# Patient Record
Sex: Female | Born: 1955 | Race: White | Hispanic: No | Marital: Married | Smoking: Never smoker
Health system: Southern US, Community
[De-identification: ages and names within clinical notes are randomized; demographics above are authoritative.]

## PROBLEM LIST (undated history)

## (undated) DIAGNOSIS — G40909 Epilepsy, unspecified, not intractable, without status epilepticus: Secondary | ICD-10-CM

## (undated) DIAGNOSIS — IMO0002 Reserved for concepts with insufficient information to code with codable children: Secondary | ICD-10-CM

## (undated) DIAGNOSIS — E669 Obesity, unspecified: Secondary | ICD-10-CM

## (undated) DIAGNOSIS — M329 Systemic lupus erythematosus, unspecified: Secondary | ICD-10-CM

## (undated) DIAGNOSIS — R569 Unspecified convulsions: Secondary | ICD-10-CM

## (undated) DIAGNOSIS — D6861 Antiphospholipid syndrome: Secondary | ICD-10-CM

## (undated) HISTORY — DX: Obesity, unspecified: E66.9

## (undated) HISTORY — DX: Epilepsy, unspecified, not intractable, without status epilepticus: G40.909

## (undated) HISTORY — DX: Reserved for concepts with insufficient information to code with codable children: IMO0002

## (undated) HISTORY — DX: Antiphospholipid syndrome: D68.61

## (undated) HISTORY — PX: TONSILLECTOMY: SUR1361

## (undated) HISTORY — PX: WISDOM TOOTH EXTRACTION: SHX21

## (undated) HISTORY — DX: Systemic lupus erythematosus, unspecified: M32.9

## (undated) HISTORY — DX: Unspecified convulsions: R56.9

---

## 1998-02-13 ENCOUNTER — Encounter: Payer: Self-pay | Admitting: Emergency Medicine

## 1998-02-13 ENCOUNTER — Emergency Department (HOSPITAL_COMMUNITY): Admission: EM | Admit: 1998-02-13 | Discharge: 1998-02-13 | Payer: Self-pay | Admitting: Emergency Medicine

## 1998-11-19 ENCOUNTER — Other Ambulatory Visit: Admission: RE | Admit: 1998-11-19 | Discharge: 1998-11-19 | Payer: Self-pay | Admitting: Internal Medicine

## 1999-10-19 ENCOUNTER — Encounter: Admission: RE | Admit: 1999-10-19 | Discharge: 1999-10-19 | Payer: Self-pay | Admitting: Internal Medicine

## 1999-10-19 ENCOUNTER — Encounter: Payer: Self-pay | Admitting: Internal Medicine

## 2000-03-01 ENCOUNTER — Other Ambulatory Visit: Admission: RE | Admit: 2000-03-01 | Discharge: 2000-03-01 | Payer: Self-pay | Admitting: *Deleted

## 2000-07-04 ENCOUNTER — Ambulatory Visit (HOSPITAL_COMMUNITY): Admission: RE | Admit: 2000-07-04 | Discharge: 2000-07-04 | Payer: Self-pay | Admitting: *Deleted

## 2000-11-01 ENCOUNTER — Encounter: Payer: Self-pay | Admitting: Internal Medicine

## 2000-11-01 ENCOUNTER — Encounter: Admission: RE | Admit: 2000-11-01 | Discharge: 2000-11-01 | Payer: Self-pay | Admitting: Internal Medicine

## 2001-04-12 ENCOUNTER — Other Ambulatory Visit: Admission: RE | Admit: 2001-04-12 | Discharge: 2001-04-12 | Payer: Self-pay | Admitting: Obstetrics and Gynecology

## 2001-11-02 ENCOUNTER — Encounter: Payer: Self-pay | Admitting: Internal Medicine

## 2001-11-02 ENCOUNTER — Encounter: Admission: RE | Admit: 2001-11-02 | Discharge: 2001-11-02 | Payer: Self-pay | Admitting: Internal Medicine

## 2002-07-12 ENCOUNTER — Other Ambulatory Visit: Admission: RE | Admit: 2002-07-12 | Discharge: 2002-07-12 | Payer: Self-pay | Admitting: Obstetrics and Gynecology

## 2002-11-25 ENCOUNTER — Encounter: Admission: RE | Admit: 2002-11-25 | Discharge: 2002-11-25 | Payer: Self-pay | Admitting: Internal Medicine

## 2004-01-14 ENCOUNTER — Encounter: Admission: RE | Admit: 2004-01-14 | Discharge: 2004-01-14 | Payer: Self-pay | Admitting: Internal Medicine

## 2005-03-09 ENCOUNTER — Encounter: Admission: RE | Admit: 2005-03-09 | Discharge: 2005-03-09 | Payer: Self-pay

## 2006-05-11 ENCOUNTER — Encounter: Admission: RE | Admit: 2006-05-11 | Discharge: 2006-05-11 | Payer: Self-pay

## 2007-05-23 ENCOUNTER — Encounter: Admission: RE | Admit: 2007-05-23 | Discharge: 2007-05-23 | Payer: Self-pay

## 2008-07-17 ENCOUNTER — Encounter: Admission: RE | Admit: 2008-07-17 | Discharge: 2008-07-17 | Payer: Self-pay

## 2010-06-04 NOTE — Procedures (Signed)
[place]. [facility]  Patient:    Brooke James, Brooke James                          MRN: [id number] Proc. Date: 07/04/00 Adm. Date:  [id number] Attending:  Sabino Gasser                           Procedure Report  PROCEDURE:  Colonoscopy.  ENDOSCOPIST:  Sabino Gasser, M.D.  INDICATIONS:  Rectal bleeding.  ANESTHESIA:  Demerol 50 mg, Versed 7 mg.  DESCRIPTION OF PROCEDURE:  The patient was mildly sedated in the left lateral decubitus position.  The Olympus videoscopic colonoscope was inserted in the rectum and passed under direct vision to the cecum, identified by the ileocecal valve and appendiceal orifice.  We entered into the terminal ileum which also appeared normal.  It was photographed from this point.  The colonoscope was slowly withdrawn, taking circumferential views of the entire colonic mucosa, stopping only in the rectum which appeared normal on direct view, and showed internal hemorrhoids on retroflexed view.  The endoscope was straightened and withdrawn.  The patients vital signs and pulse oximeter remained stable.  The patient tolerated the procedure well without apparent complications.  FINDINGS:  Internal  hemorrhoids, otherwise unremarkable colonoscopic examination to the cecum, including the terminal ileum.  PLAN:  A repeat examination possibly in five to 10 years. DD:  07/04/00 TD:  07/04/00 Job: [id number]

## 2010-08-12 ENCOUNTER — Other Ambulatory Visit: Payer: Self-pay | Admitting: Internal Medicine

## 2010-08-12 DIAGNOSIS — Z1231 Encounter for screening mammogram for malignant neoplasm of breast: Secondary | ICD-10-CM

## 2010-08-18 ENCOUNTER — Ambulatory Visit
Admission: RE | Admit: 2010-08-18 | Discharge: 2010-08-18 | Disposition: A | Discharge status: Home / Self Care | Payer: BC Managed Care – PPO | Source: Ambulatory Visit | Attending: Internal Medicine | Admitting: Internal Medicine

## 2010-08-18 DIAGNOSIS — Z1231 Encounter for screening mammogram for malignant neoplasm of breast: Secondary | ICD-10-CM

## 2011-09-27 ENCOUNTER — Other Ambulatory Visit: Payer: Self-pay | Admitting: Internal Medicine

## 2011-09-27 DIAGNOSIS — Z1231 Encounter for screening mammogram for malignant neoplasm of breast: Secondary | ICD-10-CM

## 2011-09-30 ENCOUNTER — Ambulatory Visit
Admission: RE | Admit: 2011-09-30 | Discharge: 2011-09-30 | Disposition: A | Discharge status: Home / Self Care | Payer: BC Managed Care – PPO | Source: Ambulatory Visit | Attending: Internal Medicine | Admitting: Internal Medicine

## 2011-09-30 DIAGNOSIS — Z1231 Encounter for screening mammogram for malignant neoplasm of breast: Secondary | ICD-10-CM

## 2012-10-13 ENCOUNTER — Encounter: Payer: Self-pay | Admitting: Nurse Practitioner

## 2012-10-18 ENCOUNTER — Ambulatory Visit (INDEPENDENT_AMBULATORY_CARE_PROVIDER_SITE_OTHER): Payer: BC Managed Care – PPO | Admitting: Nurse Practitioner

## 2012-10-18 ENCOUNTER — Encounter: Payer: Self-pay | Admitting: Nurse Practitioner

## 2012-10-18 VITALS — BP 114/74 | HR 64 | Ht 62.5 in | Wt 188.0 lb

## 2012-10-18 DIAGNOSIS — G40309 Generalized idiopathic epilepsy and epileptic syndromes, not intractable, without status epilepticus: Secondary | ICD-10-CM | POA: Insufficient documentation

## 2012-10-18 DIAGNOSIS — Z79899 Other long term (current) drug therapy: Secondary | ICD-10-CM

## 2012-10-18 MED ORDER — DIVALPROEX SODIUM ER 500 MG PO TB24: 500.0000 mg | ORAL_TABLET | Freq: Every day | ORAL | Status: DC

## 2012-10-18 NOTE — Progress Notes (Signed)
I have read the note, and I agree with the clinical assessment and plan.  Loral Campi KEITH   

## 2012-10-18 NOTE — Patient Instructions (Addendum)
Depakote 500mg  ER 3 daily Check VPA level F/U yearly and prn

## 2012-10-18 NOTE — Progress Notes (Signed)
[facility]  PATIENT: Brooke James DOB: 07/02/1955   REASON FOR VISIT: Followup seizure disorder   HISTORY OF PRESENT ILLNESS:Brooke James is a 57year-old right-handed white female with a history of nocturnal seizures. The patient is on Depakote   taking 500 mg in the morning and 1000 mg in the evening. The patient is tolerating this medication very well with the exception of a fine action tremor associated with the medication.  The patient denies any other significant medical issues since last seen. The patient has had routine blood work done through her primary care physician that includes a CBC and a liver profile. The patient has not had any further seizures since last seen. She needs refills today.     REVIEW OF SYSTEMS: Full 14 system review of systems performed and notable only for:  Constitutional: N/A  Cardiovascular: N/A  Ear/Nose/Throat: N/A  Skin: N/A  Eyes: N/A  Respiratory: N/A  Gastroitestinal: N/A  Hematology/Lymphatic: N/A  Endocrine: N/A Musculoskeletal:N/A  Allergy/Immunology: N/A  Neurological: Tremor Psychiatric: N/A   ALLERGIES: Allergies  Allergen Reactions  . Penicillins Rash    HOME MEDICATIONS: Outpatient Prescriptions Prior to Visit  Medication Sig Dispense Refill  . aspirin 81 MG chewable tablet Chew 81 mg by mouth daily.      . Calcium Carbonate-Vitamin D (CALTRATE 600+D) 600-400 MG-UNIT per tablet Take 1 tablet by mouth 3 (three) times daily with meals.      . divalproex (DEPAKOTE) 500 MG DR tablet Take 500 mg by mouth 3 (three) times daily.      . Flaxseed, Linseed, (FLAX SEED OIL) 1000 MG CAPS Take 1,000 mg by mouth daily.      . hydroxychloroquine (PLAQUENIL) 200 MG tablet Take 200 mg by mouth 2 (two) times daily.      . Multiple Vitamin (MULTIVITAMIN) capsule Take 1 capsule by mouth daily.       No facility-administered medications prior to visit.    PAST MEDICAL HISTORY: Past Medical History  Diagnosis Date  .  Seizures   . Lupus   . Antiphospholipid antibody syndrome   . Obesity     PAST SURGICAL HISTORY: Past Surgical History  Procedure Laterality Date  . Tonsillectomy    . Wisdom tooth extraction      FAMILY HISTORY: Family History  Problem Relation Age of Onset  . Coronary artery disease Father deceased   . Anemia Father   . Prostate cancer Father   . Arthritis Mother   . Seizures      SOCIAL HISTORY: History   Social History  . Marital Status: Married    Spouse Name: N/A    Number of Children: 0  . Years of Education: PhD   Occupational History  . Not on file.   Social History Main Topics  . Smoking status: Never Smoker   . Smokeless tobacco: Never Used  . Alcohol Use: Yes     Comment: occ  . Drug Use: No  . Sexual Activity: Not on file   Other Topics Concern  . Not on file   Social History Narrative   Patient is married.    Patient has no children.    Patient has a PhD in Microbiologist.            PHYSICAL EXAM  Filed Vitals:   10/18/12 1035  BP: 114/74  Pulse: 64  Height: 5' 2.5" (1.588 m)  Weight: 188 lb (85.276 kg)   Body mass index is 33.82 kg/(m^2).  Generalized: Well  developed, obese female in no acute distress  Head: normocephalic and atraumatic Oropharynx benign  Neck: Supple, no carotid bruits  Cardiac: Regular rate rhythm, no murmur  Musculoskeletal: No deformity   Neurological examination   Mentation: Alert oriented to time, place, history taking. Follows all commands speech and language fluent  Cranial nerve II-XII: Pupils were equal round reactive to light extraocular movements were full, visual field were full on confrontational test. Facial sensation and strength were normal. hearing was intact to finger rubbing bilaterally. Uvula tongue midline. head turning and shoulder shrug and were normal and symmetric.Tongue protrusion into cheek strength was normal. Motor: normal bulk and tone, full strength in the BUE, BLE, fine finger  movements normal, no pronator drift. No focal weakness Coordination: finger-nose-finger, heel-to-shin bilaterally, no dysmetria Reflexes: Brachioradialis 2/2, biceps 2/2, triceps 2/2, patellar 2/2, Achilles 2/2, plantar responses were flexor bilaterally. Gait and Station: Rising up from seated position without assistance, normal stance, without trunk ataxia, moderate stride, good arm swing, smooth turning, able to perform tiptoe, and heel walking without difficulty.   DIAGNOSTIC DATA (LABS, IMAGING, TESTING) - None to review ASSESSMENT AND PLAN  57 y.o. year old female  has a past medical history of Seizures; Lupus; Antiphospholipid antibody syndrome; and Obesity. here to followup for seizure disorder. No seizures in several years. CBC and CMP are routinely done by primary care physician.   Depakote 500mg  ER 3 daily Written rx given per pt request Check VPA level F/U yearly and prn Nilda Riggs, [facility], [facility], APRN  [facility] [address], [address] [place], [place] [address] [phone]

## 2012-10-22 NOTE — Progress Notes (Signed)
Quick Note:  I called and LMVM for pt on cell #. Labs look good. She is to call back with questions if needed. ______

## 2012-12-20 ENCOUNTER — Other Ambulatory Visit: Payer: Self-pay

## 2012-12-20 DIAGNOSIS — Z1231 Encounter for screening mammogram for malignant neoplasm of breast: Secondary | ICD-10-CM

## 2013-01-14 ENCOUNTER — Encounter: Payer: Self-pay | Admitting: Family Medicine

## 2013-01-21 ENCOUNTER — Other Ambulatory Visit: Payer: Self-pay | Admitting: Internal Medicine

## 2013-01-21 DIAGNOSIS — E049 Nontoxic goiter, unspecified: Secondary | ICD-10-CM

## 2013-01-29 ENCOUNTER — Ambulatory Visit
Admission: RE | Admit: 2013-01-29 | Discharge: 2013-01-29 | Disposition: A | Discharge status: Home / Self Care | Payer: BC Managed Care – PPO | Source: Ambulatory Visit

## 2013-01-29 DIAGNOSIS — Z1231 Encounter for screening mammogram for malignant neoplasm of breast: Secondary | ICD-10-CM

## 2013-02-05 ENCOUNTER — Ambulatory Visit
Admission: RE | Admit: 2013-02-05 | Discharge: 2013-02-05 | Disposition: A | Discharge status: Home / Self Care | Payer: BC Managed Care – PPO | Source: Ambulatory Visit | Attending: Internal Medicine | Admitting: Internal Medicine

## 2013-02-05 DIAGNOSIS — E049 Nontoxic goiter, unspecified: Secondary | ICD-10-CM

## 2013-08-16 ENCOUNTER — Encounter: Payer: Self-pay | Admitting: Nurse Practitioner

## 2013-10-15 ENCOUNTER — Other Ambulatory Visit: Payer: Self-pay | Admitting: Nurse Practitioner

## 2013-10-17 ENCOUNTER — Ambulatory Visit (INDEPENDENT_AMBULATORY_CARE_PROVIDER_SITE_OTHER): Payer: BC Managed Care – PPO | Admitting: Nurse Practitioner

## 2013-10-17 ENCOUNTER — Encounter (INDEPENDENT_AMBULATORY_CARE_PROVIDER_SITE_OTHER): Payer: Self-pay

## 2013-10-17 ENCOUNTER — Encounter: Payer: Self-pay | Admitting: Nurse Practitioner

## 2013-10-17 VITALS — BP 137/81 | HR 73 | Ht 62.5 in | Wt 190.0 lb

## 2013-10-17 DIAGNOSIS — G40309 Generalized idiopathic epilepsy and epileptic syndromes, not intractable, without status epilepticus: Secondary | ICD-10-CM

## 2013-10-17 LAB — VALPROIC ACID LEVEL: Valproic Acid Lvl: 80 ug/mL (ref 50–100)

## 2013-10-17 MED ORDER — DIVALPROEX SODIUM ER 500 MG PO TB24: ORAL_TABLET | ORAL | Status: DC

## 2013-10-17 NOTE — Progress Notes (Signed)
[facility]  PATIENT: Brooke James DOB: [birth year]   REASON FOR VISIT: Followup for seizure disorder    HISTORY OF PRESENT ILLNESS:Brooke James is a 58year-old right-handed white female with a history of nocturnal seizures. The patient is on Depakote taking 500 mg in the morning and 1000 mg in the evening. The patient is tolerating this medication very well with the exception of a fine action tremor associated with the medication. The patient denies any other significant medical issues since last seen. The patient has had routine blood work done through her primary care physician that includes a CBC and a liver profile. The patient has not had any further seizures in several years. She needs refills today. She returns for reevaluation   REVIEW OF SYSTEMS: Full 14 system review of systems performed and notable only for those listed, all others are neg:  Constitutional: N/A  Cardiovascular: N/A  Ear/Nose/Throat: N/A  Skin: N/A  Eyes: N/A  Respiratory: N/A  Gastroitestinal: N/A  Hematology/Lymphatic: N/A  Endocrine: N/A Musculoskeletal:N/A  Allergy/Immunology: N/A  Neurological: N/A Psychiatric: N/A Sleep : NA   ALLERGIES: Allergies  Allergen Reactions  . Penicillins Rash    HOME MEDICATIONS: Outpatient Prescriptions Prior to Visit  Medication Sig Dispense Refill  . aspirin 81 MG chewable tablet Chew 81 mg by mouth daily.      . Calcium Carbonate-Vitamin D (CALTRATE 600+D) 600-400 MG-UNIT per tablet Take 1 tablet by mouth 3 (three) times daily with meals.      Marland Kitchen. DEPAKOTE ER 500 MG 24 hr tablet TAKE 1 TABLET BY MOUTH IN THE MORNING AND THEN TAKE 2 TABLETS AT BEDTIME  90 tablet  0  . Flaxseed, Linseed, (FLAX SEED OIL) 1000 MG CAPS Take 1,000 mg by mouth daily.      . hydroxychloroquine (PLAQUENIL) 200 MG tablet Take 200 mg by mouth 2 (two) times daily.      . Multiple Vitamin (MULTIVITAMIN) capsule Take 1 capsule by mouth daily.       No  facility-administered medications prior to visit.    PAST MEDICAL HISTORY: Past Medical History  Diagnosis Date  . Seizures   . Lupus   . Antiphospholipid antibody syndrome   . Obesity     PAST SURGICAL HISTORY: Past Surgical History  Procedure Laterality Date  . Tonsillectomy    . Wisdom tooth extraction      FAMILY HISTORY: Family History  Problem Relation Age of Onset  . Coronary artery disease Father   . Anemia Father   . Prostate cancer Father   . Arthritis Mother   . Seizures      SOCIAL HISTORY: History   Social History  . Marital Status: Married    Spouse Name: N/A    Number of Children: 0  . Years of Education: PhD   Occupational History  . Not on file.   Social History Main Topics  . Smoking status: Never Smoker   . Smokeless tobacco: Never Used  . Alcohol Use: Yes     Comment: occ  . Drug Use: No  . Sexual Activity: Not on file   Other Topics Concern  . Not on file   Social History Narrative   Patient is married.    Patient has no children.    Patient has a PhD in MicrobiologistAnthropology.            PHYSICAL EXAM  Filed Vitals:   10/17/13 1359  BP: 146/83  Pulse: 73  Height: 5' 2.5" (1.588  m)  Weight: 190 lb (86.183 kg)   Body mass index is 34.18 kg/(m^2). Generalized: Well developed, obese female in no acute distress  Head: normocephalic and atraumatic Oropharynx benign  Neck: Supple, no carotid bruits  Musculoskeletal: No deformity  Neurological examination  Mentation: Alert oriented to time, place, history taking. Follows all commands speech and language fluent  Cranial nerve II-XII: Pupils were equal round reactive to light extraocular movements were full, visual field were full on confrontational test. Facial sensation and strength were normal. hearing was intact to finger rubbing bilaterally. Uvula tongue midline. head turning and shoulder shrug and were normal and symmetric.Tongue protrusion into cheek strength was normal.  Motor:  normal bulk and tone, full strength in the BUE, BLE, fine finger movements normal, no pronator drift. No focal weakness  Coordination: finger-nose-finger, heel-to-shin bilaterally, no dysmetria  Reflexes: Brachioradialis 2/2, biceps 2/2, triceps 2/2, patellar 2/2, Achilles 2/2, plantar responses were flexor bilaterally.  Gait and Station: Rising up from seated position without assistance, normal stance, without trunk ataxia, moderate stride, good arm swing, smooth turning, able to perform tiptoe, and heel walking without difficulty.   DIAGNOSTIC DATA (LABS, IMAGING, TESTING) - ASSESSMENT AND PLAN  58 y.o. year old female  has a past medical history of Seizures; Lupus; Antiphospholipid antibody syndrome; and Obesity. here to follow up.   Depakote 500 mg ER, 3 daily, will refill Check VPA level Followup yearly and when necessary Call for any seizure activity Nilda Riggs, [facility], [facility], APRN  [facility] [address], [address] [place], [place] [address] [phone]  the whole is

## 2013-10-17 NOTE — Progress Notes (Signed)
I have read the note, and I agree with the clinical assessment and plan.  Karcyn Menn KEITH   

## 2013-10-17 NOTE — Patient Instructions (Signed)
Depakote 500 mg ER, 3 daily, will refill Check VPA level Followup yearly and when necessary Call for any seizure activity

## 2013-10-18 ENCOUNTER — Ambulatory Visit: Payer: BC Managed Care – PPO | Admitting: Nurse Practitioner

## 2013-10-21 ENCOUNTER — Telehealth: Payer: Self-pay | Admitting: *Deleted

## 2013-10-21 NOTE — Telephone Encounter (Signed)
Message copied by Salome SpottedOBERTS, Shayana Hornstein M on Mon Oct 21, 2013  1:17 PM ------      Message from: Beverely LowMARTIN, NANCY      Created: Thu Oct 17, 2013  6:20 PM       Please call normal results            ----- Message -----         From: [facility] Lab Results In Interface         Sent: 10/17/2013   4:43 PM           To: Nilda RiggsNancy Carolyn Martin, NP                   ------

## 2013-10-21 NOTE — Telephone Encounter (Signed)
Patient calling in about message she received, relayed message of normal labs, patient verbalized understanding and had no further questions or concerns.

## 2013-10-21 NOTE — Telephone Encounter (Signed)
lvm with normal labs

## 2013-11-28 ENCOUNTER — Other Ambulatory Visit: Payer: Self-pay | Admitting: Neurology

## 2014-02-04 LAB — CBC AND DIFFERENTIAL
HCT: 43 (ref 36–46)
Hemoglobin: 14.2 (ref 12.0–16.0)
Platelets: 212 (ref 150–399)
WBC: 6.6

## 2014-02-04 LAB — HEPATIC FUNCTION PANEL
ALK PHOS: 60 (ref 25–125)
ALT: 26 (ref 7–35)
AST: 29 (ref 13–35)
Bilirubin, Total: 0.3

## 2014-02-04 LAB — BASIC METABOLIC PANEL
BUN: 16 (ref 4–21)
CREATININE: 0.8 (ref 0.5–1.1)
GLUCOSE: 76
POTASSIUM: 4.3 (ref 3.4–5.3)
Sodium: 139 (ref 137–147)

## 2014-02-04 LAB — LIPID PANEL
Cholesterol: 171 (ref 0–200)
HDL: 85 — AB (ref 35–70)
LDL Cholesterol: 78
Triglycerides: 40 (ref 40–160)

## 2014-02-04 LAB — TSH: TSH: 2.65 (ref 0.41–5.90)

## 2014-02-20 ENCOUNTER — Other Ambulatory Visit: Payer: Self-pay

## 2014-02-20 DIAGNOSIS — Z1231 Encounter for screening mammogram for malignant neoplasm of breast: Secondary | ICD-10-CM

## 2014-02-26 ENCOUNTER — Ambulatory Visit
Admission: RE | Admit: 2014-02-26 | Discharge: 2014-02-26 | Disposition: A | Discharge status: Home / Self Care | Payer: BC Managed Care – PPO | Source: Ambulatory Visit

## 2014-02-26 DIAGNOSIS — Z1231 Encounter for screening mammogram for malignant neoplasm of breast: Secondary | ICD-10-CM

## 2014-04-24 ENCOUNTER — Other Ambulatory Visit: Payer: Self-pay | Admitting: Orthopedic Surgery

## 2014-04-25 ENCOUNTER — Other Ambulatory Visit: Payer: Self-pay | Admitting: Orthopedic Surgery

## 2014-06-30 ENCOUNTER — Encounter (HOSPITAL_BASED_OUTPATIENT_CLINIC_OR_DEPARTMENT_OTHER): Payer: Self-pay | Admitting: *Deleted

## 2014-07-01 ENCOUNTER — Ambulatory Visit (HOSPITAL_BASED_OUTPATIENT_CLINIC_OR_DEPARTMENT_OTHER)
Admission: RE | Admit: 2014-07-01 | Discharge: 2014-07-01 | Disposition: A | Discharge status: Home / Self Care | Payer: BC Managed Care – PPO | Source: Ambulatory Visit | Attending: Orthopedic Surgery | Admitting: Orthopedic Surgery

## 2014-07-01 ENCOUNTER — Encounter (HOSPITAL_BASED_OUTPATIENT_CLINIC_OR_DEPARTMENT_OTHER): Payer: Self-pay | Admitting: Orthopedic Surgery

## 2014-07-01 ENCOUNTER — Ambulatory Visit (HOSPITAL_BASED_OUTPATIENT_CLINIC_OR_DEPARTMENT_OTHER): Payer: BC Managed Care – PPO | Admitting: Anesthesiology

## 2014-07-01 ENCOUNTER — Encounter (HOSPITAL_BASED_OUTPATIENT_CLINIC_OR_DEPARTMENT_OTHER)
Admission: RE | Disposition: A | Discharge status: Home / Self Care | Payer: Self-pay | Source: Ambulatory Visit | Attending: Orthopedic Surgery

## 2014-07-01 DIAGNOSIS — Z7982 Long term (current) use of aspirin: Secondary | ICD-10-CM | POA: Insufficient documentation

## 2014-07-01 DIAGNOSIS — D6861 Antiphospholipid syndrome: Secondary | ICD-10-CM | POA: Insufficient documentation

## 2014-07-01 DIAGNOSIS — M1990 Unspecified osteoarthritis, unspecified site: Secondary | ICD-10-CM | POA: Diagnosis not present

## 2014-07-01 DIAGNOSIS — Z6835 Body mass index (BMI) 35.0-35.9, adult: Secondary | ICD-10-CM | POA: Insufficient documentation

## 2014-07-01 DIAGNOSIS — R2241 Localized swelling, mass and lump, right lower limb: Secondary | ICD-10-CM | POA: Diagnosis not present

## 2014-07-01 DIAGNOSIS — R569 Unspecified convulsions: Secondary | ICD-10-CM | POA: Insufficient documentation

## 2014-07-01 DIAGNOSIS — L930 Discoid lupus erythematosus: Secondary | ICD-10-CM | POA: Insufficient documentation

## 2014-07-01 DIAGNOSIS — Z79899 Other long term (current) drug therapy: Secondary | ICD-10-CM | POA: Insufficient documentation

## 2014-07-01 DIAGNOSIS — E669 Obesity, unspecified: Secondary | ICD-10-CM | POA: Insufficient documentation

## 2014-07-01 DIAGNOSIS — M720 Palmar fascial fibromatosis [Dupuytren]: Secondary | ICD-10-CM | POA: Insufficient documentation

## 2014-07-01 HISTORY — PX: FASCIOTOMY: SHX132

## 2014-07-01 LAB — POCT HEMOGLOBIN-HEMACUE: Hemoglobin: 14.3 g/dL (ref 12.0–15.0)

## 2014-07-01 SURGERY — FASCIOTOMY, UPPER EXTREMITY
Anesthesia: General | Site: Finger | Laterality: Right

## 2014-07-01 MED ORDER — PROMETHAZINE HCL 25 MG/ML IJ SOLN: 6.2500 mg | INTRAMUSCULAR | Status: DC | PRN

## 2014-07-01 MED ORDER — BUPIVACAINE HCL (PF) 0.25 % IJ SOLN
INTRAMUSCULAR | Status: AC
  Filled 2014-07-01: qty 180

## 2014-07-01 MED ORDER — GLYCOPYRROLATE 0.2 MG/ML IJ SOLN
INTRAMUSCULAR | Status: DC | PRN
  Administered 2014-07-01: 0.2 mg via INTRAVENOUS

## 2014-07-01 MED ORDER — CHLORHEXIDINE GLUCONATE 4 % EX LIQD: 60.0000 mL | Freq: Once | CUTANEOUS | Status: DC

## 2014-07-01 MED ORDER — ACETAMINOPHEN 500 MG PO TABS
1000.0000 mg | ORAL_TABLET | Freq: Once | ORAL | Status: DC
Start: 2014-07-01 — End: 2014-07-01

## 2014-07-01 MED ORDER — LACTATED RINGERS IV SOLN
INTRAVENOUS | Status: DC
  Administered 2014-07-01: 08:00:00 via INTRAVENOUS

## 2014-07-01 MED ORDER — MIDAZOLAM HCL 2 MG/2ML IJ SOLN
1.0000 mg | INTRAMUSCULAR | Status: DC | PRN
  Administered 2014-07-01: 2 mg via INTRAVENOUS

## 2014-07-01 MED ORDER — VANCOMYCIN HCL IN DEXTROSE 1-5 GM/200ML-% IV SOLN
1000.0000 mg | INTRAVENOUS | Status: DC
Start: 2014-07-01 — End: 2014-07-01

## 2014-07-01 MED ORDER — ACETAMINOPHEN 160 MG/5ML PO SOLN
960.0000 mg | Freq: Once | ORAL | Status: DC
Start: 2014-07-01 — End: 2014-07-01

## 2014-07-01 MED ORDER — PROPOFOL 10 MG/ML IV BOLUS
INTRAVENOUS | Status: DC | PRN
  Administered 2014-07-01: 170 mg via INTRAVENOUS

## 2014-07-01 MED ORDER — DEXAMETHASONE SODIUM PHOSPHATE 4 MG/ML IJ SOLN
INTRAMUSCULAR | Status: DC | PRN
  Administered 2014-07-01: 10 mg via INTRAVENOUS

## 2014-07-01 MED ORDER — THROMBIN 5000 UNITS EX SOLR
CUTANEOUS | Status: AC
  Filled 2014-07-01: qty 5000

## 2014-07-01 MED ORDER — LIDOCAINE HCL (CARDIAC) 20 MG/ML IV SOLN
INTRAVENOUS | Status: DC | PRN
  Administered 2014-07-01: 80 mg via INTRAVENOUS

## 2014-07-01 MED ORDER — HYDROMORPHONE HCL 1 MG/ML IJ SOLN
0.2500 mg | INTRAMUSCULAR | Status: DC | PRN
Start: 2014-07-01 — End: 2014-07-01

## 2014-07-01 MED ORDER — VANCOMYCIN HCL IN DEXTROSE 1-5 GM/200ML-% IV SOLN
1000.0000 mg | INTRAVENOUS | Status: AC
  Administered 2014-07-01: 1000 mg via INTRAVENOUS

## 2014-07-01 MED ORDER — 0.9 % SODIUM CHLORIDE (POUR BTL) OPTIME
TOPICAL | Status: DC | PRN
  Administered 2014-07-01: 200 mL

## 2014-07-01 MED ORDER — FENTANYL CITRATE (PF) 100 MCG/2ML IJ SOLN
INTRAMUSCULAR | Status: DC | PRN
  Administered 2014-07-01: 100 ug via INTRAVENOUS

## 2014-07-01 MED ORDER — HYDROCODONE-ACETAMINOPHEN 5-325 MG PO TABS: 1.0000 | ORAL_TABLET | Freq: Four times a day (QID) | ORAL | Status: DC | PRN

## 2014-07-01 MED ORDER — BUPIVACAINE HCL (PF) 0.25 % IJ SOLN
INTRAMUSCULAR | Status: DC | PRN
Start: 2014-07-01 — End: 2014-07-01
  Administered 2014-07-01: 6 mL

## 2014-07-01 MED ORDER — FENTANYL CITRATE (PF) 100 MCG/2ML IJ SOLN
INTRAMUSCULAR | Status: AC
  Filled 2014-07-01: qty 4

## 2014-07-01 MED ORDER — MIDAZOLAM HCL 2 MG/ML PO SYRP: 0.5000 mg/kg | ORAL_SOLUTION | Freq: Once | ORAL | Status: DC

## 2014-07-01 MED ORDER — VANCOMYCIN HCL IN DEXTROSE 1-5 GM/200ML-% IV SOLN
INTRAVENOUS | Status: AC
  Filled 2014-07-01: qty 200

## 2014-07-01 MED ORDER — MIDAZOLAM HCL 2 MG/2ML IJ SOLN
INTRAMUSCULAR | Status: AC
  Filled 2014-07-01: qty 2

## 2014-07-01 MED ORDER — SCOPOLAMINE 1 MG/3DAYS TD PT72: 1.0000 | MEDICATED_PATCH | Freq: Once | TRANSDERMAL | Status: DC | PRN

## 2014-07-01 SURGICAL SUPPLY — 50 items
BLADE MINI RND TIP GREEN BEAV (BLADE) ×3 IMPLANT
BLADE SURG 15 STRL LF DISP TIS (BLADE) ×1 IMPLANT
BLADE SURG 15 STRL SS (BLADE) ×3
BNDG CMPR 9X4 STRL LF SNTH (GAUZE/BANDAGES/DRESSINGS) ×1
BNDG COHESIVE 3X5 TAN STRL LF (GAUZE/BANDAGES/DRESSINGS) ×3 IMPLANT
BNDG ESMARK 4X9 LF (GAUZE/BANDAGES/DRESSINGS) ×3 IMPLANT
BNDG GAUZE ELAST 4 BULKY (GAUZE/BANDAGES/DRESSINGS) ×3 IMPLANT
CHLORAPREP W/TINT 26ML (MISCELLANEOUS) ×3 IMPLANT
CORDS BIPOLAR (ELECTRODE) ×3 IMPLANT
COVER BACK TABLE 60X90IN (DRAPES) ×3 IMPLANT
COVER MAYO STAND STRL (DRAPES) ×3 IMPLANT
CUFF TOURNIQUET SINGLE 18IN (TOURNIQUET CUFF) ×2 IMPLANT
DECANTER SPIKE VIAL GLASS SM (MISCELLANEOUS) IMPLANT
DRAPE EXTREMITY T 121X128X90 (DRAPE) ×3 IMPLANT
DRAPE SURG 17X23 STRL (DRAPES) ×3 IMPLANT
DRSG PAD ABDOMINAL 8X10 ST (GAUZE/BANDAGES/DRESSINGS) IMPLANT
GAUZE SPONGE 4X4 12PLY STRL (GAUZE/BANDAGES/DRESSINGS) ×3 IMPLANT
GAUZE XEROFORM 1X8 LF (GAUZE/BANDAGES/DRESSINGS) ×3 IMPLANT
GLOVE BIOGEL PI IND STRL 7.5 (GLOVE) IMPLANT
GLOVE BIOGEL PI IND STRL 8 (GLOVE) IMPLANT
GLOVE BIOGEL PI IND STRL 8.5 (GLOVE) ×1 IMPLANT
GLOVE BIOGEL PI INDICATOR 7.5 (GLOVE) ×2
GLOVE BIOGEL PI INDICATOR 8 (GLOVE) ×2
GLOVE BIOGEL PI INDICATOR 8.5 (GLOVE) ×2
GLOVE EXAM NITRILE MD LF STRL (GLOVE) ×2 IMPLANT
GLOVE SURG ORTHO 8.0 STRL STRW (GLOVE) ×3 IMPLANT
GLOVE SURG SS PI 7.0 STRL IVOR (GLOVE) ×2 IMPLANT
GLOVE SURG SS PI 7.5 STRL IVOR (GLOVE) ×2 IMPLANT
GOWN STRL REUS W/ TWL LRG LVL3 (GOWN DISPOSABLE) ×1 IMPLANT
GOWN STRL REUS W/TWL LRG LVL3 (GOWN DISPOSABLE) ×3
GOWN STRL REUS W/TWL XL LVL3 (GOWN DISPOSABLE) ×5 IMPLANT
NDL PRECISIONGLIDE 27X1.5 (NEEDLE) IMPLANT
NEEDLE PRECISIONGLIDE 27X1.5 (NEEDLE) ×3 IMPLANT
NS IRRIG 1000ML POUR BTL (IV SOLUTION) ×3 IMPLANT
PACK BASIN DAY SURGERY FS (CUSTOM PROCEDURE TRAY) ×3 IMPLANT
PAD CAST 3X4 CTTN HI CHSV (CAST SUPPLIES) IMPLANT
PADDING CAST ABS 4INX4YD NS (CAST SUPPLIES)
PADDING CAST ABS COTTON 4X4 ST (CAST SUPPLIES) ×1 IMPLANT
PADDING CAST COTTON 3X4 STRL (CAST SUPPLIES)
SLEEVE SCD COMPRESS KNEE MED (MISCELLANEOUS) ×3 IMPLANT
SPLINT PLASTER CAST XFAST 3X15 (CAST SUPPLIES) ×10 IMPLANT
SPLINT PLASTER XTRA FASTSET 3X (CAST SUPPLIES)
STOCKINETTE 4X48 STRL (DRAPES) ×3 IMPLANT
SUT ETHILON 4 0 PS 2 18 (SUTURE) ×2 IMPLANT
SUT ETHILON 5 0 PS 2 18 (SUTURE) ×1 IMPLANT
SUT SILK 4 0 PS 2 (SUTURE) IMPLANT
SYR BULB 3OZ (MISCELLANEOUS) ×3 IMPLANT
SYR CONTROL 10ML LL (SYRINGE) ×2 IMPLANT
TOWEL OR 17X24 6PK STRL BLUE (TOWEL DISPOSABLE) ×4 IMPLANT
UNDERPAD 30X30 (UNDERPADS AND DIAPERS) ×3 IMPLANT

## 2014-07-01 NOTE — Anesthesia Procedure Notes (Signed)
Procedure Name: LMA Insertion Date/Time: 07/01/2014 8:41 AM Performed by: Gar Gibbon Pre-anesthesia Checklist: Patient identified, Emergency Drugs available, Suction available and Patient being monitored Patient Re-evaluated:Patient Re-evaluated prior to inductionOxygen Delivery Method: Circle System Utilized Preoxygenation: Pre-oxygenation with 100% oxygen Intubation Type: IV induction Ventilation: Mask ventilation without difficulty LMA: LMA inserted LMA Size: 3.0 Number of attempts: 1 Airway Equipment and Method: Bite block Placement Confirmation: positive ETCO2 Tube secured with: Tape Dental Injury: Teeth and Oropharynx as per pre-operative assessment

## 2014-07-01 NOTE — Transfer of Care (Signed)
Immediate Anesthesia Transfer of Care Note  Patient: Brooke James  Procedure(s) Performed: Procedure(s): FASCIOTOMY RIGHT MIDDLE FINGER/RIGHT RING FINGER (Right)  Patient Location: PACU  Anesthesia Type:General  Level of Consciousness: awake, sedated and patient cooperative  Airway & Oxygen Therapy: Patient Spontanous Breathing and Patient connected to face mask oxygen  Post-op Assessment: Report given to RN and Post -op Vital signs reviewed and stable  Post vital signs: Reviewed and stable  Last Vitals:  Filed Vitals:   07/01/14 0719  BP: 126/78  Pulse: 67  Temp: 36.5 C  Resp: 20    Complications: No apparent anesthesia complications

## 2014-07-01 NOTE — H&P (Signed)
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 Dareen Piano for Dupuytren's contractures to her bilateral hands.  These have been going on for years for her. She recalls no history of injury. She does have history of lupus, no history of diabetes or thyroid problems.  She is much more involved on her right side than her left.  She complains of an intermittent pain.  She feels it is gradually getting worse. Heat helps.  She is not complaining of numbness or tingling.  She is of Irish/Swedish/German/French descent.  She states she has lupus, has nodules on her PIP joint which she was told are secondary to that. She also has lumps in the sole of her right foot instep area.  She has family history of both father and grandfather with fingers being pulled down.  She also has a brother who she feels has similar problem.    ALLERGIES:      Penicillin. MEDICATIONS:      Depakote (for history of seizures), Plaquenil, and aspirin. SURGICAL HISTORY:     Tonsillectomy and childbirth. FAMILY MEDICAL HISTORY:    Positive for heart disease and arthritis.   SOCIAL HISTORY:      She does not smoke, drinks socially, married and a professor. REVIEW OF SYSTEMS:    Positive for glasses and blood clots, seizures, otherwise negative 14 points.  [name] is an 59 y.o. female.   Chief Complaint: dupuytren's contracture right hand HPI: see [email or web address]  Past Medical History  Diagnosis Date  . Lupus   . Antiphospholipid antibody syndrome   . Obesity   . Seizures     night tremors, last 2006    Past Surgical History  Procedure Laterality Date  . Tonsillectomy    . Wisdom tooth extraction      Family History  Problem Relation Age of Onset  . Coronary artery disease Father   . Anemia Father   . Prostate cancer Father   . Arthritis Mother   . Seizures     Social History:  reports that she has never smoked. She has never used smokeless tobacco. She reports that  she drinks alcohol. She reports that she does not use illicit drugs.  Allergies:  Allergies  Allergen Reactions  . Penicillins Rash    No prescriptions prior to admission    No results found for this or any previous visit (from the past 48 hour(s)).  No results found.   Pertinent items are noted in HPI.  Height 5\' 2"  (1.575 m), weight 86.183 kg (190 lb).  General appearance: alert, cooperative and appears stated age Head: Normocephalic, without obvious abnormality Neck: no JVD Resp: clear to auscultation bilaterally Cardio: regular rate and rhythm, S1, S2 normal, no murmur, click, rub or gallop GI: soft, non-tender; bowel sounds normal; no masses,  no organomegaly Extremities: dupuytren's cords middle and ring fingers right Pulses: 2+ and symmetric Skin: Skin color, texture, turgor normal. No rashes or lesions Neurologic: Grossly normal Incision/Wound: na  Assessment/Plan RADIOGRAPHS:     X-rays reveal no significant degenerative changes.    DIAGNOSIS:     Dupuytren's contracture.  RECOMMENDATIONS/PLAN:      We have discussed this with her.  Etiology of various treatment alternatives with her including aponeurotomy, Xiaflex injection, fasciotomy, fasciectomy, the risks and complications of each are discussed. She is aware that there is no guarantee with any of the surgery, the possibility of infection, recurrence, injury to arteries/nerves/tendons, incomplete relief  of symptoms and dystrophy with any of the treatments, along with recurrence rate.  She has elected to undergo fasciotomy. She is scheduled as an outpatient for fasciotomy right middle and ring fingers.  Emersyn Kotarski R 07/01/2014, 5:26 AM

## 2014-07-01 NOTE — Anesthesia Preprocedure Evaluation (Addendum)
Anesthesia Evaluation  Patient identified by MRN, date of birth, ID band Patient awake    Airway Mallampati: II  TM Distance: >3 FB Neck ROM: Full    Dental  (+) Teeth Intact   Pulmonary neg pulmonary ROS,  breath sounds clear to auscultation        Cardiovascular negative cardio ROS  Rhythm:Regular Rate:Normal     Neuro/Psych Seizures -,     GI/Hepatic negative GI ROS, Neg liver ROS,   Endo/Other  negative endocrine ROS  Renal/GU negative Renal ROS     Musculoskeletal  (+) Arthritis -,   Abdominal (+) + obese,   Peds  Hematology Lupus   Anesthesia Other Findings   Reproductive/Obstetrics                            Anesthesia Physical Anesthesia Plan  ASA: II  Anesthesia Plan: General   Post-op Pain Management:    Induction: Intravenous  Airway Management Planned: LMA  Additional Equipment:   Intra-op Plan:   Post-operative Plan: Extubation in OR  Informed Consent: I have reviewed the patients History and Physical, chart, labs and discussed the procedure including the risks, benefits and alternatives for the proposed anesthesia with the patient or authorized representative who has indicated his/her understanding and acceptance.     Plan Discussed with: CRNA  Anesthesia Plan Comments:         Anesthesia Quick Evaluation

## 2014-07-01 NOTE — Op Note (Signed)
Dictation [phone]

## 2014-07-01 NOTE — Discharge Instructions (Addendum)

## 2014-07-01 NOTE — Brief Op Note (Signed)
07/01/2014  9:28 AM  PATIENT:  Nunzio Cory  59 y.o. female  PRE-OPERATIVE DIAGNOSIS:  Dupuytren's/Contracture Right Middle/Ring  POST-OPERATIVE DIAGNOSIS:  Dupuytren's/Contracture Right Middle/Ring  PROCEDURE:  Procedure(s): FASCIOTOMY RIGHT MIDDLE FINGER/RIGHT RING FINGER (Right)  SURGEON:  Surgeon(s) and Role:    * Cindee Salt, MD - Primary  PHYSICIAN ASSISTANT:   ASSISTANTS: none   ANESTHESIA:   general  And local EBL:  Total I/O In: 1000 [I.V.:1000] Out: -   BLOOD ADMINISTERED:none  DRAINS: none   LOCAL MEDICATIONS USED:  BUPIVICAINE   SPECIMEN:  Excision  DISPOSITION OF SPECIMEN:  PATHOLOGY  COUNTS:  YES  TOURNIQUET:   Total Tourniquet Time Documented: Upper Arm (Right) - 23 minutes Total: Upper Arm (Right) - 23 minutes   DICTATION: [id number]  PLAN OF CARE: Discharge to home after PACU  PATIENT DISPOSITION:  PACU - hemodynamically stable.

## 2014-07-01 NOTE — Anesthesia Postprocedure Evaluation (Signed)
  Anesthesia Post-op Note  Patient: Brooke James  Procedure(s) Performed: Procedure(s): FASCIOTOMY RIGHT MIDDLE FINGER/RIGHT RING FINGER (Right)  Patient Location: PACU  Anesthesia Type:General  Level of Consciousness: awake and alert   Airway and Oxygen Therapy: Patient Spontanous Breathing  Post-op Pain: mild  Post-op Assessment: Post-op Vital signs reviewed              Post-op Vital Signs: stable  Last Vitals:  Filed Vitals:   07/01/14 1003  BP: 128/84  Pulse: 79  Temp: 36.5 C  Resp: 16    Complications: No apparent anesthesia complications

## 2014-07-02 ENCOUNTER — Encounter (HOSPITAL_BASED_OUTPATIENT_CLINIC_OR_DEPARTMENT_OTHER): Payer: Self-pay | Admitting: Orthopedic Surgery

## 2014-07-02 NOTE — Op Note (Signed)
Brooke James, QUATTRONE                 ACCOUNT NO.:  [id number]  MEDICAL RECORD NO.:  [id number]  LOCATION:                               FACILITY:  [facility]  PHYSICIAN:  Cindee Salt, M.D.       DATE OF BIRTH:  1955/12/13  DATE OF PROCEDURE:  07/01/2014 DATE OF DISCHARGE:  07/01/2014                              OPERATIVE REPORT   PREOPERATIVE DIAGNOSIS:  Dupuytren contracture, right middle, right ring finger.  POSTOPERATIVE DIAGNOSIS:  Dupuytren contracture, right middle, right ring finger.  OPERATION:  Fasciotomies of right middle, right ring fingers.  SURGEON:  Cindee Salt, M.D.  ANESTHESIA:  General with local infiltration.  ANESTHESIOLOGIST:  Zenon Mayo, MD.  HISTORY:  The patient is a 59 year old female with a history of Dupuytren contracture, cords to the middle and ring fingers of her right hand.  She is desirous having this released.  Pre, peri, postoperative course have been discussed along with risks and complications.  She is aware that there is no guarantee with the surgery, possibility of infection; recurrence of injury to arteries, nerves, tendons; incomplete relief of symptoms; dystrophy.  The probability that this will recover. We have discussed other treatment alternatives including epineurotomy, Xiaflex injections, fasciectomy.  She has elected to proceed with fasciotomy rather than fasciectomy or Xiaflex injection.  In preoperative area, the patient is seen, the extremity marked by both the patient and surgeon.  Antibiotic given.  PROCEDURE IN DETAIL:  The patient was brought to the operating room, where a general anesthetic was carried out without difficulty.  She was prepped using ChloraPrep, supine position, right arm free.  A 3-minute dry time was allowed.  Time-out taken, confirming the patient and procedure.  The limb was exsanguinated with an Esmarch bandage. Tourniquet placed on the upper arm, was inflated to 250 mmHg.  A transverse incision was  made at the juncture of the palmar fascia. Transverse retinacular ligament carried down through subcutaneous tissue.  Neurovascular structures identified.  The cord isolated and cut taking a segment out with a Beaver Blade and sharp dissection.  Two separate incisions were then made distally on the ring finger again carried down through subcutaneous tissue.  Isolation neurovascular bundles performed.  The fascia isolated and the cord transected with sharp dissection allowing the ring finger to come fully straight. Middle finger was attended to next.  A transverse incision made over the central aspect of the cord carried down again isolating neurovascular bundles and the cord.  Cord was then transected with sharp dissection. This allowed the middle finger to come fully straight.  Wounds were copiously irrigated with saline.  Specimen was sent to Pathology.  The wounds were then closed with interrupted 4-0 nylon sutures.  A local infiltration with 0.25% bupivacaine without epinephrine was given, approximately 6 mL was used.  Sterile compressive dressing applied.  The tourniquet deflated and all fingers immediately pinked.  She was taken to the recovery room for observation in satisfactory condition.  She will be discharged home to return to the [facility] [place] in 1 week on Vicodin.    ______________________________ Cindee Salt, M.D.   ______________________________ Cindee Salt, M.D.  GK/MEDQ  D:  07/01/2014  T:  07/02/2014  Job:  [id number]

## 2014-10-20 ENCOUNTER — Ambulatory Visit: Payer: BC Managed Care – PPO | Admitting: Nurse Practitioner

## 2014-10-21 ENCOUNTER — Ambulatory Visit (INDEPENDENT_AMBULATORY_CARE_PROVIDER_SITE_OTHER): Payer: BC Managed Care – PPO | Admitting: Nurse Practitioner

## 2014-10-21 ENCOUNTER — Encounter: Payer: Self-pay | Admitting: Nurse Practitioner

## 2014-10-21 VITALS — BP 120/80 | HR 80 | Ht 62.25 in | Wt 195.0 lb

## 2014-10-21 DIAGNOSIS — G40309 Generalized idiopathic epilepsy and epileptic syndromes, not intractable, without status epilepticus: Secondary | ICD-10-CM | POA: Diagnosis not present

## 2014-10-21 DIAGNOSIS — Z5181 Encounter for therapeutic drug level monitoring: Secondary | ICD-10-CM | POA: Diagnosis not present

## 2014-10-21 MED ORDER — DIVALPROEX SODIUM ER 500 MG PO TB24: ORAL_TABLET | ORAL | Status: DC

## 2014-10-21 NOTE — Progress Notes (Signed)
[facility]  PATIENT: Brooke James DOB: [birth year]   REASON FOR VISIT: Follow-up for generalized epilepsy HISTORY FROM: Patient    HISTORY OF PRESENT ILLNESS:Brooke James is a 59 year-old right-handed white female with a history of nocturnal seizures. The patient is on Depakote taking 500 mg in the morning and 1000 mg in the evening. The patient is tolerating this medication very well with the exception of a fine action tremor associated with the medication. The patient denies any other significant medical issues since last seen. The patient has had routine blood work done through her primary care physician that includes a CBC and a liver profile. The patient has not had any seizures in several years. She needs refills today. She returns for reevaluation   REVIEW OF SYSTEMS: Full 14 system review of systems performed and notable only for those listed, all others are neg:  Constitutional: neg  Cardiovascular: neg Ear/Nose/Throat: neg  Skin: neg Eyes: neg Respiratory: neg Gastroitestinal: neg  Hematology/Lymphatic: neg  Endocrine: neg Musculoskeletal:neg Allergy/Immunology: neg Neurological: neg Psychiatric: neg Sleep : neg   ALLERGIES: Allergies  Allergen Reactions  . Penicillins Rash    HOME MEDICATIONS: Outpatient Prescriptions Prior to Visit  Medication Sig Dispense Refill  . aspirin 81 MG chewable tablet Chew 81 mg by mouth daily.    . Calcium Carbonate-Vitamin D (CALTRATE 600+D) 600-400 MG-UNIT per tablet Take 1 tablet by mouth 3 (three) times daily with meals.    Marland Kitchen DEPAKOTE ER 500 MG 24 hr tablet TAKE 1 TABLET BY MOUTH EVERY MORNING AND 2 TABLETS BY MOUTH EVERY NIGHT AT BEDTIME 90 tablet 11  . hydroxychloroquine (PLAQUENIL) 200 MG tablet Take 200 mg by mouth 2 (two) times daily.    Marland Kitchen HYDROcodone-acetaminophen (NORCO) 5-325 MG per tablet Take 1 tablet by mouth every 6 (six) hours as needed for moderate pain. (Patient not taking: Reported on  10/21/2014) 30 tablet 0   No facility-administered medications prior to visit.    PAST MEDICAL HISTORY: Past Medical History  Diagnosis Date  . Lupus (HCC)   . Antiphospholipid antibody syndrome (HCC)   . Obesity   . Seizures (HCC)     night tremors, last 2006    PAST SURGICAL HISTORY: Past Surgical History  Procedure Laterality Date  . Tonsillectomy    . Wisdom tooth extraction    . Fasciotomy Right 07/01/2014    Procedure: FASCIOTOMY RIGHT MIDDLE FINGER/RIGHT RING FINGER;  Surgeon: Cindee Salt, MD;  Location: [facility];  Service: Orthopedics;  Laterality: Right;    FAMILY HISTORY: Family History  Problem Relation Age of Onset  . Coronary artery disease Father   . Anemia Father   . Prostate cancer Father   . Arthritis Mother   . Seizures      SOCIAL HISTORY: Social History   Social History  . Marital Status: Married    Spouse Name: N/A  . Number of Children: 0  . Years of Education: PhD   Occupational History  . Not on file.   Social History Main Topics  . Smoking status: Never Smoker   . Smokeless tobacco: Never Used  . Alcohol Use: Yes     Comment: occ  . Drug Use: No  . Sexual Activity: Not on file   Other Topics Concern  . Not on file   Social History Narrative   Patient is married.    Patient has no children.    Patient has a PhD in Microbiologist.  PHYSICAL EXAM  Filed Vitals:   10/21/14 1128  BP: 120/80  Pulse: 80  Height: 5' 2.25" (1.581 m)  Weight: 195 lb (88.451 kg)   Body mass index is 35.39 kg/(m^2). Generalized: Well developed, obese female in no acute distress  Head: normocephalic and atraumatic Oropharynx benign  Neck: Supple, no carotid bruits  Musculoskeletal: No deformity  Neurological examination  Mentation: Alert oriented to time, place, history taking. Follows all commands speech and language fluent  Cranial nerve II-XII: Pupils were equal round reactive to light extraocular movements  were full, visual field were full on confrontational test. Facial sensation and strength were normal. hearing was intact to finger rubbing bilaterally. Uvula tongue midline. head turning and shoulder shrug and were normal and symmetric.Tongue protrusion into cheek strength was normal.  Motor: normal bulk and tone, full strength in the BUE, BLE, fine finger movements normal, no pronator drift. No focal weakness  Coordination: finger-nose-finger, heel-to-shin bilaterally, no dysmetria  Reflexes: Brachioradialis 2/2, biceps 2/2, triceps 2/2, patellar 2/2, Achilles 2/2, plantar responses were flexor bilaterally.  Gait and Station: Rising up from seated position without assistance, normal stance, without trunk ataxia, moderate stride, good arm swing, smooth turning, able to perform tiptoe, and heel walking without difficulty. orm tiptoe, and heel walking without difficulty. Tandem gait is steady  DIAGNOSTIC DATA (LABS, IMAGING, TESTING) - I reviewed patient records, labs, notes, testing and imaging myself where available.  Lab Results  Component Value Date   HGB 14.3 07/01/2014    ASSESSMENT AND PLAN  59 y.o. year old female  has a past medical history of  generalized  Seizures here to follow-up. No seizure activity in several years . The patient is a current patient of Dr. Anne Hahn  who is out of the office today . This note is sent to the work in doctor.     PLAN Continue Depakote 500 mg ER, 3 daily, will refill Check VPA level today CBC and liver function followed by primary care Followup yearly and when necessary Call for any seizure activity Nilda Riggs, [facility], [facility], APRN  [facility] [address], [address] [place], [place] [address] [phone]

## 2014-10-21 NOTE — Patient Instructions (Signed)
Depakote 500 mg ER, 3 daily, will refill Check VPA level Followup yearly and when necessary

## 2014-10-22 ENCOUNTER — Telehealth: Payer: Self-pay | Admitting: *Deleted

## 2014-10-22 LAB — VALPROIC ACID LEVEL: Valproic Acid Lvl: 90 ug/mL (ref 50–100)

## 2014-10-22 NOTE — Progress Notes (Signed)
I agree with the assessment and plan as directed by NP .The patient is known to Dr.Willis   Sophia Sperry, MD  

## 2014-10-22 NOTE — Telephone Encounter (Signed)
-----   Message from Nilda Riggs, NP sent at 10/22/2014  8:17 AM EDT ----- Good VPA level please call the patient

## 2014-10-22 NOTE — Telephone Encounter (Signed)
LVM for pt to call about results. Gave [facility] phone number.   Spoke w/ pt husband, Channing Mutters who is listed on pt DPR. Advised lab work was normal. VPA level was good. He verbalized understanding and will let his wife know.

## 2014-10-26 ENCOUNTER — Other Ambulatory Visit: Payer: Self-pay | Admitting: Nurse Practitioner

## 2015-01-30 ENCOUNTER — Telehealth: Payer: Self-pay | Admitting: Neurology

## 2015-01-30 NOTE — Telephone Encounter (Signed)
Our records do not show this Rx is DAW.  We contacted ins and provided clinical info.  Request is under review Ref # [id number]  [facility] stated there is no prior auth required for this medication, it is covered under the current benefit plan, however it is just too soon to refill at this time.  I called the pharmacy and spoke with Alisha.  She reviewed pateint file and confirmed no prior auth was required.  As well, they show Rx is refill too soon, not due again until Feb.  I called the patient back to relay this info.  Got no answer.  Left message.

## 2015-01-30 NOTE — Telephone Encounter (Signed)
[facility] stating she can not have generic depakote. She said to call 1st at phone #[phone] [facility] to be given instructions.

## 2015-02-03 ENCOUNTER — Other Ambulatory Visit: Payer: Self-pay

## 2015-02-03 DIAGNOSIS — Z1231 Encounter for screening mammogram for malignant neoplasm of breast: Secondary | ICD-10-CM

## 2015-02-06 ENCOUNTER — Other Ambulatory Visit: Payer: Self-pay | Admitting: Obstetrics & Gynecology

## 2015-02-06 ENCOUNTER — Other Ambulatory Visit (HOSPITAL_COMMUNITY)
Admission: RE | Admit: 2015-02-06 | Discharge: 2015-02-06 | Disposition: A | Discharge status: Home / Self Care | Payer: BC Managed Care – PPO | Source: Ambulatory Visit | Attending: Obstetrics & Gynecology | Admitting: Obstetrics & Gynecology

## 2015-02-06 DIAGNOSIS — Z01419 Encounter for gynecological examination (general) (routine) without abnormal findings: Secondary | ICD-10-CM | POA: Insufficient documentation

## 2015-02-06 DIAGNOSIS — Z1151 Encounter for screening for human papillomavirus (HPV): Secondary | ICD-10-CM | POA: Diagnosis present

## 2015-02-10 LAB — TSH
TSH: 2.05 (ref 0.41–5.90)
TSH: 2.05 (ref 0.41–5.90)

## 2015-02-10 LAB — CBC AND DIFFERENTIAL
HEMATOCRIT: 42 (ref 36–46)
HEMOGLOBIN: 14.2 (ref 12.0–16.0)
Platelets: 190 (ref 150–399)
WBC: 5.6

## 2015-02-10 LAB — LIPID PANEL
CHOLESTEROL: 168 (ref 0–200)
Cholesterol: 168 (ref 0–200)
HDL: 90 — AB (ref 35–70)
HDL: 90 — AB (ref 35–70)
LDL CALC: 70
LDL Cholesterol: 70
TRIGLYCERIDES: 40 (ref 40–160)
Triglycerides: 40 (ref 40–160)

## 2015-02-10 LAB — CYTOLOGY - PAP

## 2015-02-10 LAB — BASIC METABOLIC PANEL
BUN: 15 (ref 4–21)
Creatinine: 0.6 (ref 0.5–1.1)
GLUCOSE: 77
Potassium: 4.4 (ref 3.4–5.3)
SODIUM: 140 (ref 137–147)

## 2015-02-10 LAB — HEPATIC FUNCTION PANEL
ALK PHOS: 59 (ref 25–125)
ALT: 19 (ref 7–35)
AST: 28 (ref 13–35)
BILIRUBIN, TOTAL: 0.3

## 2015-03-03 ENCOUNTER — Telehealth: Payer: Self-pay | Admitting: Neurology

## 2015-03-03 ENCOUNTER — Ambulatory Visit
Admission: RE | Admit: 2015-03-03 | Discharge: 2015-03-03 | Disposition: A | Discharge status: Home / Self Care | Payer: BC Managed Care – PPO | Source: Ambulatory Visit

## 2015-03-03 DIAGNOSIS — Z1231 Encounter for screening mammogram for malignant neoplasm of breast: Secondary | ICD-10-CM

## 2015-03-03 NOTE — Telephone Encounter (Signed)
Patient called back to advise Rx drug coverage is through [facility], Subscriber# [id number] Grp# [id number]  RX BIN# [id number]/RXPCN [id number]/[id number].

## 2015-03-03 NOTE — Telephone Encounter (Signed)
Patient called to advise DEPAKOTE ER 500 MG 24 hr tablet is now being filled through [facility] pharmacy due to change with [employer], cost was $5 for 90 day supply previously, now with [facility] cost is $500 ($400 with discount card), was advised that Dr. Anne Hahn could initiate a process asking for a formulary exception, reclassifying from T3 to T2, call [employer] [phone].

## 2015-03-03 NOTE — Telephone Encounter (Signed)
I called the patient to find out who her prescription drug coverage is with and what her member id is. I LVM asking she call back.

## 2015-03-04 NOTE — Telephone Encounter (Signed)
I am working on the patient's PA and have a few questions for her. I left a voicemail asking she call me back.

## 2015-03-04 NOTE — Telephone Encounter (Signed)
I spoke to the patient. She has only taken Depakote. She did initially take Depakote IR but was switched to ER after having another seizure. She stated her last seizure was probably in 2006 and that is also when she last had diagnostic testing.

## 2015-03-05 NOTE — Telephone Encounter (Signed)
I received a fax from [facility] stating PA is not needed for this medication. I called the patient. She stated she needs a tier exception. I advised I would call [facility] to see if there is another process other than a PA to get approval for tier exception.

## 2015-03-05 NOTE — Telephone Encounter (Signed)
I called [employer] and [facility]. I have been on the phone for 1 hour trying to request a tier exception. After speaking to 7 different people, Jilda Panda told me that I could appeal on behalf of the member but would still need a written statement from the patient. He advised the plan is already covering it based on their criteria. The patient must meet her deductible before her copay will drop. Jilda Panda advised the patient call the member line to request a lower copay. I called the patient and explained this to her. She will call the member line and will call us back if she needs [place].

## 2015-07-01 LAB — HEPATIC FUNCTION PANEL
ALT: 20 (ref 7–35)
AST: 25 (ref 13–35)
Alkaline Phosphatase: 57 (ref 25–125)
Bilirubin, Total: 0.3

## 2015-07-01 LAB — BASIC METABOLIC PANEL
BUN: 12 (ref 4–21)
CREATININE: 0.5 (ref 0.5–1.1)
Glucose: 79
POTASSIUM: 4.7 (ref 3.4–5.3)
Sodium: 135 — AB (ref 137–147)

## 2015-07-01 LAB — CBC AND DIFFERENTIAL
HCT: 42 (ref 36–46)
HEMOGLOBIN: 14.5 (ref 12.0–16.0)
Platelets: 197 (ref 150–399)
WBC: 6.4

## 2015-10-22 ENCOUNTER — Encounter: Payer: Self-pay | Admitting: Nurse Practitioner

## 2015-10-22 ENCOUNTER — Ambulatory Visit (INDEPENDENT_AMBULATORY_CARE_PROVIDER_SITE_OTHER): Payer: BC Managed Care – PPO | Admitting: Nurse Practitioner

## 2015-10-22 VITALS — BP 128/82 | HR 60 | Ht 66.0 in | Wt 194.4 lb

## 2015-10-22 DIAGNOSIS — Z5181 Encounter for therapeutic drug level monitoring: Secondary | ICD-10-CM

## 2015-10-22 DIAGNOSIS — G40309 Generalized idiopathic epilepsy and epileptic syndromes, not intractable, without status epilepticus: Secondary | ICD-10-CM

## 2015-10-22 MED ORDER — DIVALPROEX SODIUM ER 500 MG PO TB24: ORAL_TABLET | ORAL | 11 refills | Status: DC

## 2015-10-22 NOTE — Progress Notes (Signed)
I have read the note, and I agree with the clinical assessment and plan.  WILLIS,CHARLES KEITH   

## 2015-10-22 NOTE — Progress Notes (Signed)
[facility]  PATIENT: Brooke James DOB: [birth year]   REASON FOR VISIT: Follow-up for generalized epilepsy HISTORY FROM: Patient    HISTORY OF PRESENT ILLNESS:Brooke James is a 60 year-old right-handed white female with a history of nocturnal seizures. The patient is on Depakote taking 500 mg in the morning and 1000 mg in the evening. The patient is tolerating this medication very well with the exception of a fine action tremor associated with the medication. The patient denies any other significant medical issues since last seen. The patient has had routine blood work done through her primary care physician that includes a CBC and a liver profile. The patient has not had any seizures in several years. She needs refills today. She returns for reevaluation   REVIEW OF SYSTEMS: Full 14 system review of systems performed and notable only for those listed, all others are neg:  Constitutional: neg  Cardiovascular: neg Ear/Nose/Throat: neg  Skin: neg Eyes: neg Respiratory: neg Gastroitestinal: neg  Hematology/Lymphatic: neg  Endocrine: neg Musculoskeletal:neg Allergy/Immunology: neg Neurological: neg Psychiatric: neg Sleep : neg   ALLERGIES: Allergies  Allergen Reactions  . Penicillins Rash    HOME MEDICATIONS: Outpatient Medications Prior to Visit  Medication Sig Dispense Refill  . aspirin 81 MG chewable tablet Chew 81 mg by mouth daily.    Marland Kitchen DEPAKOTE ER 500 MG 24 hr tablet TAKE 1 TABLET BY MOUTH EVERY MORNING AND 2 TABLETS BY MOUTH EVERY NIGHT AT BEDTIME 90 tablet 11  . hydroxychloroquine (PLAQUENIL) 200 MG tablet Take 200 mg by mouth 2 (two) times daily.    . naproxen sodium (ANAPROX) 220 MG tablet Take 220 mg by mouth daily as needed.    . Calcium Carbonate-Vitamin D (CALTRATE 600+D) 600-400 MG-UNIT per tablet Take 1 tablet by mouth 3 (three) times daily with meals.     No facility-administered medications prior to visit.     PAST MEDICAL  HISTORY: Past Medical History:  Diagnosis Date  . Antiphospholipid antibody syndrome (HCC)   . Lupus   . Obesity   . Seizures (HCC)    night tremors, last 2006    PAST SURGICAL HISTORY: Past Surgical History:  Procedure Laterality Date  . FASCIOTOMY Right 07/01/2014   Procedure: FASCIOTOMY RIGHT MIDDLE FINGER/RIGHT RING FINGER;  Surgeon: Cindee Salt, MD;  Location: [facility];  Service: Orthopedics;  Laterality: Right;  . TONSILLECTOMY    . WISDOM TOOTH EXTRACTION      FAMILY HISTORY: Family History  Problem Relation Age of Onset  . Coronary artery disease Father   . Anemia Father   . Prostate cancer Father   . Arthritis Mother   . Seizures      SOCIAL HISTORY: Social History   Social History  . Marital status: Married    Spouse name: N/A  . Number of children: 0  . Years of education: PhD   Occupational History  . Not on file.   Social History Main Topics  . Smoking status: Never Smoker  . Smokeless tobacco: Never Used  . Alcohol use Yes     Comment: occ  . Drug use: No  . Sexual activity: Not on file   Other Topics Concern  . Not on file   Social History Narrative   Patient is married.    Patient has no children.    Patient has a PhD in Microbiologist.            PHYSICAL EXAM  Vitals:   10/22/15 1514  BP: 128/82  Pulse: 60  Weight: 194 lb 6.4 oz (88.2 kg)  Height: 5\' 6"  (1.676 m)   Body mass index is 31.38 kg/m. Generalized: Well developed, obese female in no acute distress  Head: normocephalic and atraumatic Oropharynx benign  Neck: Supple, no carotid bruits  Musculoskeletal: No deformity  Neurological examination  Mentation: Alert oriented to time, place, history taking. Follows all commands speech and language fluent  Cranial nerve II-XII: Pupils were equal round reactive to light extraocular movements were full, visual field were full on confrontational test. Facial sensation and strength were normal. hearing was  intact to finger rubbing bilaterally. Uvula tongue midline. head turning and shoulder shrug and were normal and symmetric.Tongue protrusion into cheek strength was normal.  Motor: normal bulk and tone, full strength in the BUE, BLE, fine finger movements normal, no pronator drift. No focal weakness  Coordination: finger-nose-finger, heel-to-shin bilaterally, no dysmetria  Reflexes: Brachioradialis 2/2, biceps 2/2, triceps 2/2, patellar 2/2, Achilles 2/2, plantar responses were flexor bilaterally.  Gait and Station: Rising up from seated position without assistance, normal stance, without trunk ataxia, moderate stride, good arm swing, smooth turning, able to perform tiptoe, and heel walking without difficulty. orm tiptoe, and heel walking without difficulty. Tandem gait is steady  DIAGNOSTIC DATA (LABS, IMAGING, TESTING) - I reviewed patient records, labs, notes, testing and imaging myself where available.  Lab Results  Component Value Date   HGB 14.3 07/01/2014    ASSESSMENT AND PLAN  60 y.o. year old female  has a past medical history of  generalized  Seizures here to follow-up. No seizure activity in several years .      PLAN Continue Depakote 500 mg ER, 3 daily, will refill Check VPA level today CBC and liver function followed by primary care Followup yearly and when necessary Call for any seizure activity Nilda RiggsNancy Carolyn Jiaire James, [facility] LPGNP, [facility], APRN  [facility] [address], [address] [place], [place] [id number] [phone]

## 2015-10-22 NOTE — Patient Instructions (Signed)
Continue Depakote 500 mg ER, 3 daily, will refill Check VPA level today CBC and liver function followed by primary care Followup yearly and when necessary

## 2015-10-23 ENCOUNTER — Telehealth: Payer: Self-pay | Admitting: *Deleted

## 2015-10-23 LAB — VALPROIC ACID LEVEL: Valproic Acid Lvl: 72 ug/mL (ref 50–100)

## 2015-10-23 NOTE — Telephone Encounter (Signed)
-----   Message from Nilda RiggsNancy Carolyn Martin, NP sent at 10/23/2015  8:04 AM EDT ----- Good level of Depakote , please call the patient

## 2015-10-23 NOTE — Telephone Encounter (Signed)
LMVM home (ok per DPR) that lab result was good level (depakote).  She is to call back if questions.

## 2015-11-17 ENCOUNTER — Other Ambulatory Visit: Payer: Self-pay | Admitting: Nurse Practitioner

## 2016-02-16 LAB — BASIC METABOLIC PANEL
BUN: 18 (ref 4–21)
BUN: 18 (ref 4–21)
CREATININE: 0.6 (ref 0.5–1.1)
Creatinine: 0.6 (ref 0.5–1.1)
GLUCOSE: 89
GLUCOSE: 89
POTASSIUM: 4.7 (ref 3.4–5.3)
POTASSIUM: 4.7 (ref 3.4–5.3)
SODIUM: 144 (ref 137–147)
Sodium: 144 (ref 137–147)

## 2016-02-16 LAB — LIPID PANEL
CHOLESTEROL: 170 (ref 0–200)
Cholesterol: 170 (ref 0–200)
HDL: 89 — AB (ref 35–70)
HDL: 89 — AB (ref 35–70)
LDL Cholesterol: 73
TRIGLYCERIDES: 40 (ref 40–160)
Triglycerides: 40 (ref 40–160)

## 2016-02-16 LAB — CBC AND DIFFERENTIAL
HCT: 44 (ref 36–46)
HEMATOCRIT: 44 (ref 36–46)
HEMOGLOBIN: 14.7 (ref 12.0–16.0)
HEMOGLOBIN: 14.7 (ref 12.0–16.0)
Platelets: 209 (ref 150–399)
Platelets: 209 (ref 150–399)
WBC: 6.2
WBC: 6.2

## 2016-02-16 LAB — HEPATIC FUNCTION PANEL
ALT: 24 (ref 7–35)
ALT: 24 (ref 7–35)
AST: 28 (ref 13–35)
AST: 28 (ref 13–35)
Alkaline Phosphatase: 54 (ref 25–125)
Bilirubin, Total: 0.3

## 2016-02-16 LAB — TSH: TSH: 4.21 (ref 0.41–5.90)

## 2016-03-11 ENCOUNTER — Other Ambulatory Visit: Payer: Self-pay | Admitting: Internal Medicine

## 2016-03-11 DIAGNOSIS — Z1231 Encounter for screening mammogram for malignant neoplasm of breast: Secondary | ICD-10-CM

## 2016-03-29 ENCOUNTER — Ambulatory Visit
Admission: RE | Admit: 2016-03-29 | Discharge: 2016-03-29 | Disposition: A | Discharge status: Home / Self Care | Payer: BC Managed Care – PPO | Source: Ambulatory Visit | Attending: Internal Medicine | Admitting: Internal Medicine

## 2016-03-29 DIAGNOSIS — Z1231 Encounter for screening mammogram for malignant neoplasm of breast: Secondary | ICD-10-CM

## 2016-10-24 NOTE — Progress Notes (Signed)
[facility]  PATIENT: AJAHNAE RATHGEBER DOB: January 02, 1956   REASON FOR VISIT: Follow-up for generalized epilepsy HISTORY FROM: Patient    HISTORY OF PRESENT ILLNESS:Ms Simar is a 61 year-old right-handed white female with a history of nocturnal seizures. The patient is on Depakote taking 500 mg in the morning and 1000 mg in the evening. The patient is tolerating this medication very well with the exception of a fine action tremor associated with the medication. The patient denies any other significant medical issues since last seen. The patient has not had  routine blood work done through her primary care physician. The patient has not had any seizures in greater than 5 years.  She needs refills and labs today. She returns for reevaluation.She claims her lupus is in good control at present.  REVIEW OF SYSTEMS: Full 14 system review of systems performed and notable only for those listed, all others are neg:  Constitutional: neg  Cardiovascular: neg Ear/Nose/Throat: neg  Skin: neg Eyes: neg Respiratory: neg Gastroitestinal: neg  Hematology/Lymphatic: neg  Endocrine: neg Musculoskeletal:joint pain Allergy/Immunology: neg Neurological: history of seizure disorder Psychiatric: neg Sleep : neg   ALLERGIES: Allergies  Allergen Reactions  . Penicillins Rash    HOME MEDICATIONS: Outpatient Medications Prior to Visit  Medication Sig Dispense Refill  . aspirin 81 MG chewable tablet Chew 81 mg by mouth 2 (two) times daily.     . divalproex (DEPAKOTE ER) 500 MG 24 hr tablet TAKE 1 TABLET BY MOUTH EVERY MORNING AND 2 TABLETS BY MOUTH EVERY NIGHT AT BEDTIME 90 tablet 11  . Multiple Vitamin (MULTIVITAMIN) tablet Take 1 tablet by mouth daily.    . naproxen sodium (ANAPROX) 220 MG tablet Take 220 mg by mouth daily as needed.    . hydroxychloroquine (PLAQUENIL) 200 MG tablet Take 200 mg by mouth 2 (two) times daily.     No facility-administered medications prior to visit.       PAST MEDICAL HISTORY: Past Medical History:  Diagnosis Date  . Antiphospholipid antibody syndrome (HCC)   . Lupus   . Obesity   . Seizures (HCC)    night tremors, last 2006    PAST SURGICAL HISTORY: Past Surgical History:  Procedure Laterality Date  . FASCIOTOMY Right 07/01/2014   Procedure: FASCIOTOMY RIGHT MIDDLE FINGER/RIGHT RING FINGER;  Surgeon: Cindee Salt, MD;  Location: [facility];  Service: Orthopedics;  Laterality: Right;  . TONSILLECTOMY    . WISDOM TOOTH EXTRACTION      FAMILY HISTORY: Family History  Problem Relation Age of Onset  . Arthritis Mother   . Coronary artery disease Father   . Anemia Father   . Prostate cancer Father   . Seizures Unknown     SOCIAL HISTORY: Social History   Social History  . Marital status: Married    Spouse name: N/A  . Number of children: 0  . Years of education: PhD   Occupational History  . Not on file.   Social History Main Topics  . Smoking status: Never Smoker  . Smokeless tobacco: Never Used  . Alcohol use Yes     Comment: occ  . Drug use: No  . Sexual activity: Not on file   Other Topics Concern  . Not on file   Social History Narrative   Patient is married.    Patient has no children.    Patient has a PhD in Microbiologist.            PHYSICAL EXAM  Vitals:   10/25/16 1017  BP: (!) 144/77  Pulse: 77  Weight: 204 lb 6.4 oz (92.7 kg)  Height:  (1.676 m)   Body mass index is 32.99 kg/m. Generalized: Well developed, obese female in no acute distress  Head: normocephalic and atraumatic Oropharynx benign  Neck: Supple, Musculoskeletal: No deformity  Neurological examination  Mentation: Alert oriented to time, place, history taking. Follows all commands speech and language fluent  Cranial nerve II-XII: Pupils were equal round reactive to light extraocular movements were full, visual field were full on confrontational test. Facial sensation and strength were normal.  hearing was intact to finger rubbing bilaterally. Uvula tongue midline. head turning and shoulder shrug and were normal and symmetric.Tongue protrusion into cheek strength was normal.  Motor: normal bulk and tone, full strength in the BUE, BLE, fine finger movements normal, no pronator drift. No focal weakness  Coordination: finger-nose-finger, heel-to-shin bilaterally, no dysmetria  Reflexes: symmetric upper and lower, plantar responses were flexor bilaterally.  Gait and Station: Rising up from seated position without assistance, normal stance, moderate stride, good arm swing, smooth turning, able to perform tiptoe, and heel walking without difficulty.  Tandem gait is steady  DIAGNOSTIC DATA (LABS, IMAGING, TESTING) - I reviewed patient records, labs, notes, testing and imaging myself where available.  Lab Results  Component Value Date   HGB 14.3 07/01/2014    ASSESSMENT AND PLAN  61 y.o. year old female  has a past medical history of  generalized  Seizures here to follow-up. No seizure activity in greater than 5 years. Well controlled on Depakote.      PLAN Continue Depakote 500 mg ER, 3 daily, will refill Check VPA level today to monitor for therapeutic level/toxicity CBC and CMP to monitor adverse effects of Depakote Followup yearly and when necessary Call for any seizure activity Nilda Riggs, [facility], [facility], APRN  [facility] [address] [place], [place] [address] [phone]

## 2016-10-25 ENCOUNTER — Ambulatory Visit (INDEPENDENT_AMBULATORY_CARE_PROVIDER_SITE_OTHER): Payer: BC Managed Care – PPO | Admitting: Nurse Practitioner

## 2016-10-25 ENCOUNTER — Encounter: Payer: Self-pay | Admitting: Nurse Practitioner

## 2016-10-25 VITALS — BP 144/77 | HR 77 | Ht 66.0 in | Wt 204.4 lb

## 2016-10-25 DIAGNOSIS — Z5181 Encounter for therapeutic drug level monitoring: Secondary | ICD-10-CM | POA: Diagnosis not present

## 2016-10-25 DIAGNOSIS — G40309 Generalized idiopathic epilepsy and epileptic syndromes, not intractable, without status epilepticus: Secondary | ICD-10-CM

## 2016-10-25 MED ORDER — DIVALPROEX SODIUM ER 500 MG PO TB24: ORAL_TABLET | ORAL | 3 refills | Status: DC

## 2016-10-25 NOTE — Progress Notes (Signed)
I have read the note, and I agree with the clinical assessment and plan.  WILLIS,CHARLES KEITH  3 

## 2016-10-25 NOTE — Patient Instructions (Signed)
Continue Depakote 500 mg ER, 3 daily, will refill Check VPA level today to monitor for therapeutic level/toxicity CBC and CMP to monitor adverse effects of Depakote Followup yearly and when necessary Call for any seizure activity

## 2016-10-26 ENCOUNTER — Telehealth: Payer: Self-pay | Admitting: *Deleted

## 2016-10-26 LAB — COMPREHENSIVE METABOLIC PANEL
ALK PHOS: 51 IU/L (ref 39–117)
ALT: 10 IU/L (ref 0–32)
AST: 22 IU/L (ref 0–40)
Albumin/Globulin Ratio: 1.6 (ref 1.2–2.2)
Albumin: 3.6 g/dL (ref 3.6–4.8)
BILIRUBIN TOTAL: 0.2 mg/dL (ref 0.0–1.2)
BUN/Creatinine Ratio: 25 (ref 12–28)
BUN: 16 mg/dL (ref 8–27)
CHLORIDE: 101 mmol/L (ref 96–106)
CO2: 25 mmol/L (ref 20–29)
Calcium: 9.2 mg/dL (ref 8.7–10.3)
Creatinine, Ser: 0.64 mg/dL (ref 0.57–1.00)
GFR calc non Af Amer: 96 mL/min/{1.73_m2} (ref >59)
GFR, EST AFRICAN AMERICAN: 111 mL/min/{1.73_m2} (ref >59)
GLUCOSE: 88 mg/dL (ref 65–99)
Globulin, Total: 2.2 g/dL (ref 1.5–4.5)
POTASSIUM: 4.5 mmol/L (ref 3.5–5.2)
Sodium: 140 mmol/L (ref 134–144)
Total Protein: 5.8 g/dL — ABNORMAL LOW (ref 6.0–8.5)

## 2016-10-26 LAB — CBC WITH DIFFERENTIAL/PLATELET
BASOS ABS: 0 10*3/uL (ref 0.0–0.2)
BASOS: 1 %
EOS (ABSOLUTE): 0.1 10*3/uL (ref 0.0–0.4)
Eos: 2 %
HEMATOCRIT: 40.6 % (ref 34.0–46.6)
Hemoglobin: 13.5 g/dL (ref 11.1–15.9)
Immature Grans (Abs): 0 10*3/uL (ref 0.0–0.1)
Immature Granulocytes: 0 %
LYMPHS: 30 %
Lymphocytes Absolute: 1.7 10*3/uL (ref 0.7–3.1)
MCH: 31.7 pg (ref 26.6–33.0)
MCHC: 33.3 g/dL (ref 31.5–35.7)
MCV: 95 fL (ref 79–97)
MONOS ABS: 0.7 10*3/uL (ref 0.1–0.9)
Monocytes: 11 %
NEUTROS PCT: 56 %
Neutrophils Absolute: 3.2 10*3/uL (ref 1.4–7.0)
Platelets: 194 10*3/uL (ref 150–379)
RBC: 4.26 x10E6/uL (ref 3.77–5.28)
RDW: 13.9 % (ref 12.3–15.4)
WBC: 5.7 10*3/uL (ref 3.4–10.8)

## 2016-10-26 LAB — VALPROIC ACID LEVEL: VALPROIC ACID LVL: 84 ug/mL (ref 50–100)

## 2016-10-26 NOTE — Telephone Encounter (Signed)
LVM informing patient her lab results look good. Left number for any questions. 

## 2017-03-13 ENCOUNTER — Other Ambulatory Visit: Payer: Self-pay | Admitting: Obstetrics & Gynecology

## 2017-03-13 DIAGNOSIS — Z1231 Encounter for screening mammogram for malignant neoplasm of breast: Secondary | ICD-10-CM

## 2017-03-14 ENCOUNTER — Encounter: Payer: Self-pay | Admitting: Family Medicine

## 2017-03-14 ENCOUNTER — Ambulatory Visit (INDEPENDENT_AMBULATORY_CARE_PROVIDER_SITE_OTHER): Payer: BC Managed Care – PPO | Admitting: Family Medicine

## 2017-03-14 DIAGNOSIS — G8929 Other chronic pain: Secondary | ICD-10-CM | POA: Insufficient documentation

## 2017-03-14 DIAGNOSIS — M79671 Pain in right foot: Secondary | ICD-10-CM | POA: Diagnosis not present

## 2017-03-14 DIAGNOSIS — D6861 Antiphospholipid syndrome: Secondary | ICD-10-CM

## 2017-03-14 DIAGNOSIS — Z87891 Personal history of nicotine dependence: Secondary | ICD-10-CM

## 2017-03-14 DIAGNOSIS — M329 Systemic lupus erythematosus, unspecified: Secondary | ICD-10-CM | POA: Diagnosis not present

## 2017-03-14 DIAGNOSIS — M8920 Other disorders of bone development and growth, unspecified site: Secondary | ICD-10-CM | POA: Diagnosis not present

## 2017-03-14 DIAGNOSIS — R030 Elevated blood-pressure reading, without diagnosis of hypertension: Secondary | ICD-10-CM

## 2017-03-14 NOTE — Patient Instructions (Addendum)
Per patient she does not need to come back to discuss any labs if they are all normal.  However if there are any abnormalities such as vitamin D etc., she wishes to come back in to discuss.  -->  -Please if you would like to use something such as the lose it app or my fitness pal if you want to start tracking foods to see what you are doing right or wrong with your diet    Please realize, EXERCISE IS MEDICINE!  -  American Heart Association [facility]) guidelines for exercise : If you are in good health, without any medical conditions, you should engage in 150 minutes of moderate intensity aerobic activity per week.  This means you should be huffing and puffing throughout your workout.   Engaging in regular exercise will improve brain function and memory, as well as improve mood, boost immune system and help with weight management.  As well as the other, more well-known effects of exercise such as decreasing blood sugar levels, decreasing blood pressure,  and decreasing bad cholesterol levels/ increasing good cholesterol levels.     -  The AHA strongly endorses consumption of a diet that contains a variety of foods from all the food categories with an emphasis on fruits and vegetables; fat-free and low-fat dairy products; cereal and grain products; legumes and nuts; and fish, poultry, and/or extra lean meats.    Excessive food intake, especially of foods high in saturated and trans fats, sugar, and salt, should be avoided.    Adequate water intake of roughly 1/2 of your weight in pounds, should equal the ounces of water per day you should drink.  So for instance, if you're 200 pounds, that would be 100 ounces of water per day.         Mediterranean Diet  Why follow it? Research shows. . Those who follow the Mediterranean diet have a reduced risk of heart disease  . The diet is associated with a reduced incidence of Parkinson's and Alzheimer's diseases . People following the diet may have longer life  expectancies and lower rates of chronic diseases  . The Dietary Guidelines for Americans recommends the Mediterranean diet as an eating plan to promote health and prevent disease  What Is the Mediterranean Diet?  . Healthy eating plan based on typical foods and recipes of Mediterranean-style cooking . The diet is primarily a plant based diet; these foods should make up a majority of meals   Starches - Plant based foods should make up a majority of meals - They are an important sources of vitamins, minerals, energy, antioxidants, and fiber - Choose whole grains, foods high in fiber and minimally processed items  - Typical grain sources include wheat, oats, barley, corn, brown rice, bulgar, farro, millet, polenta, couscous  - Various types of beans include chickpeas, lentils, fava beans, black beans, white beans   Fruits  Veggies - Large quantities of antioxidant rich fruits & veggies; 6 or more servings  - Vegetables can be eaten raw or lightly drizzled with oil and cooked  - Vegetables common to the traditional Mediterranean Diet include: artichokes, arugula, beets, broccoli, brussel sprouts, cabbage, carrots, celery, collard greens, cucumbers, eggplant, kale, leeks, lemons, lettuce, mushrooms, okra, onions, peas, peppers, potatoes, pumpkin, radishes, rutabaga, shallots, spinach, sweet potatoes, turnips, zucchini - Fruits common to the Mediterranean Diet include: apples, apricots, avocados, cherries, clementines, dates, figs, grapefruits, grapes, melons, nectarines, oranges, peaches, pears, pomegranates, strawberries, tangerines  Fats - Replace butter and margarine  with healthy oils, such as olive oil, canola oil, and tahini  - Limit nuts to no more than a handful a day  - Nuts include walnuts, almonds, pecans, pistachios, pine nuts  - Limit or avoid candied, honey roasted or heavily salted nuts - Olives are central to the Mediterranean diet - can be eaten whole or used in a variety of dishes    Meats Protein - Limiting red meat: no more than a few times a month - When eating red meat: choose lean cuts and keep the portion to the size of deck of cards - Eggs: approx. 0 to 4 times a week  - Fish and lean poultry: at least 2 a week  - Healthy protein sources include, chicken, [place], lean beef, lamb - Increase intake of seafood such as tuna, salmon, trout, mackerel, shrimp, scallops - Avoid or limit high fat processed meats such as sausage and bacon  Dairy - Include moderate amounts of low fat dairy products  - Focus on healthy dairy such as fat free yogurt, skim milk, low or reduced fat cheese - Limit dairy products higher in fat such as whole or 2% milk, cheese, ice cream  Alcohol - Moderate amounts of red wine is ok  - No more than 5 oz daily for women (all ages) and men older than age 43  - No more than 10 oz of wine daily for men younger than 25  Other - Limit sweets and other desserts  - Use herbs and spices instead of salt to flavor foods  - Herbs and spices common to the traditional Mediterranean Diet include: basil, bay leaves, chives, cloves, cumin, fennel, garlic, lavender, marjoram, mint, oregano, parsley, pepper, rosemary, sage, savory, sumac, tarragon, thyme   It's not just a diet, it's a lifestyle:  . The Mediterranean diet includes lifestyle factors typical of those in the region  . Foods, drinks and meals are best eaten with others and savored . Daily physical activity is important for overall good health . This could be strenuous exercise like running and aerobics . This could also be more leisurely activities such as walking, housework, yard-work, or taking the stairs . Moderation is the key; a balanced and healthy diet accommodates most foods and drinks . Consider portion sizes and frequency of consumption of certain foods   Meal Ideas & Options:  . Breakfast:  o Whole wheat toast or whole wheat English muffins with peanut butter & hard boiled egg o Steel cut  oats topped with apples & cinnamon and skim milk  o Fresh fruit: banana, strawberries, melon, berries, peaches  o Smoothies: strawberries, bananas, greek yogurt, peanut butter o Low fat greek yogurt with blueberries and granola  o Egg white omelet with spinach and mushrooms o Breakfast couscous: whole wheat couscous, apricots, skim milk, cranberries  . Sandwiches:  o Hummus and grilled vegetables (peppers, zucchini, squash) on whole wheat bread   o Grilled chicken on whole wheat pita with lettuce, tomatoes, cucumbers or tzatziki  o Tuna salad on whole wheat bread: tuna salad made with greek yogurt, olives, red peppers, capers, green onions o Garlic rosemary lamb pita: lamb sauted with garlic, rosemary, salt & pepper; add lettuce, cucumber, greek yogurt to pita - flavor with lemon juice and black pepper  . Seafood:  o Mediterranean grilled salmon, seasoned with garlic, basil, parsley, lemon juice and black pepper o Shrimp, lemon, and spinach whole-grain pasta salad made with low fat greek yogurt  o Seared scallops with lemon  orzo  o Seared [employer]una steaks seasoned salt, pepper, coriander topped with tomato mixture of olives, tomatoes, olive oil, minced garlic, parsley, green onions and cappers  . Meats:  o Herbed greek chicken salad with kalamata olives, cucumber, feta  o Red bell peppers stuffed with spinach, bulgur, lean ground beef (or lentils) & topped with feta   o Kebabs: skewers of chicken, tomatoes, onions, zucchini, squash  o [place] burgers: made with red onions, mint, dill, lemon juice, feta cheese topped with roasted red peppers . Vegetarian o Cucumber salad: cucumbers, artichoke hearts, celery, red onion, feta cheese, tossed in olive oil & lemon juice  o Hummus and whole grain pita points with a greek salad (lettuce, tomato, feta, olives, cucumbers, red onion) o Lentil soup with celery, carrots made with vegetable broth, garlic, salt and pepper  o Tabouli salad: parsley, bulgur,  mint, scallions, cucumbers, tomato, radishes, lemon juice, olive oil, salt and pepper.

## 2017-03-14 NOTE — Progress Notes (Signed)
New patient office visit note:  Impression and Recommendations:    1. Severe obesity (BMI 35.0-39.9) with comorbidity (HCC)   2. Antiphospholipid antibody syndrome (HCC)   3. Systemic lupus erythematosus, unspecified SLE type, unspecified organ involvement status (HCC)   4. Smoking history- 22 yr hx- none since age 62; but really quit the 1 ppd after 22 yrs.    5. Other disorders of bone development and growth, unspecified site   6. Pain, foot, right, chronic     Elevated BP above goal: Continue to monitor blood pressure at home and write down occasionally.  (Asked Melissa to give you a blood pressure sheet to write it down and bring in next OV.)  - low salt, wt loss etc d/c pt briefly  1. Medical Records Discussed that we have to wait for her to sign the paperwork and HIPPA forms to get her past records from [facility].  2. General Health Management - Advised patient to continue working toward exercising to improve health.    - PT will begin with 15 minutes of activity daily. Recommended that the patient eventually strive for at least 150 minutes of cardio per week according to the [facility].   - Healthy dietary habits encouraged, including low-carb, and high amounts of lean protein in diet.   - Patient should also consume adequate amounts of water - half of body weight in oz of water per day.  3. Skin Health Advised that the patient should be seeing dermatology yearly for skin screenings. Per patient, has seen dermatology recently but does not remember consulting provider.  4. BMI 35-39.9 Body mass index is 35.26 kg/m.  If patient would like to start tracking foods to see what she's doing right or wrong with her diet, she was recommended the LoseIt app - or to resume Weight Watchers, as she notes she historically used Weight Watchers.  Explained to patient what BMI refers to, and what it means medically.    Told patient to think about it as a "medical risk  stratification measurement" and how increasing BMI is associated with increasing risk/ or worsening state of various diseases such as hypertension, hyperlipidemia, diabetes, premature OA, depression etc.  American Heart Association guidelines for healthy diet, basically Mediterranean diet, and exercise guidelines of 30 minutes 5 days per week or more discussed in detail.  Health counseling performed.  All questions answered.   Education and routine counseling performed. Handouts provided.  5. 2018 Lab Work Briefly reviewed lab work from 2018 with patient. Overall everything looks good - bad cholesterol is low, and good is high.  6. Ambulatory Referral to Podiatry Former PCP noted that it was a ganglion cyst.  Has been present for a year. New growth in medial aspect of plantar surface of right foot, in arch area. Three small hardened nodules.  Need evaluation and treatment. These are now causing pain and alteration in patient's gait.  7. Follow-Up Near future, will test TSH, T3 and T4. Hep C and HIV test, and Vitamin D. Patient does not need to come back to discuss labs if they are normal. However, if there are any abnormalities, she wishes to come back in to discuss.  In the near future, she will schedule a follow-up OV, and schedule fasting labs 1 week prior.  Discussed with patient that we can coordinate care and labs with her other specialists if needed.   Orders Placed This Encounter  Procedures  . CBC with Differential/Platelet  .  Comprehensive metabolic panel  . Lipid panel  . HIV antibody  . Hepatitis C antibody  . Hemoglobin A1c  . T3, free  . T4, free  . TSH  . VITAMIN D 25 Hydroxy (Vit-D Deficiency, Fractures)  . Ambulatory referral to Podiatry    Gross side effects, risk and benefits, and alternatives of medications discussed with patient.  Patient is aware that all medications have potential side effects and we are unable to predict every side effect or  drug-drug interaction that may occur.  Expresses verbal understanding and consents to current therapy plan and treatment regimen.  Return for Fasting bldwrk-near future ;then OV w me 1 wk later.  Please see AVS handed out to patient at the end of our visit for further patient instructions/ counseling done pertaining to today's office visit.    Note: This document was prepared using Dragon voice recognition software and may include unintentional dictation errors.  This document serves as a record of services personally performed by Thomasene Lot, DO. It was created on her behalf by Peggye Fothergill, a trained medical scribe. The creation of this record is based on the scribe's personal observations and the provider's statements to them.   I have reviewed the above medical documentation for accuracy and completeness and I concur.  Thomasene Lot 03/25/17 3:45 PM    ----------------------------------------------------------------------------------------------------------------------    Subjective:    Chief complaint:   Chief Complaint  Patient presents with  . Establish Care    HPI: Brooke James is a pleasant 62 y.o. female who presents to [facility] today to review their medical history with me and establish care.   I asked the patient to review their chronic problem list with me to ensure everything was updated and accurate.    All recent office visits with other providers, any medical records that patient brought in etc  - I reviewed today.     We asked pt to get [place] their medical records from [facility] providers/ specialists that they had seen within the past 3-5 years- if they are in private practice and/or do not work for [employer], [facility], [place], [facility] or [facility] owned practice.  Told them to call their specialists to clarify this if they are not sure.   Previously obtained care through [facility] with Thayer Headings.   Had been  seeing several other providers through that group.  Social History [place]. Moved [place] at the age of 17.  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 Mostly finding old garbage from people [place]cavates places [place] account [place]. When she developed lupus, couldn't do that as much. Couldn't run her company as much. 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  Can't do a lot of things, but has worked hard to turn that around to what she can do. Wears hats all the time, wears long sleeves.  Wears 100+ sunblock.  Loving husband, Channing Mutters. Married 33 years, 34 soon. Has a good relationship with him.  Never had children. Have pets - Greyhounds.  - Tobacco Use Quit smoking 1ppd 25 years ago. None since age 75. Smoked a pack per day, age 18 to age 23. Smoked ppd for 22 years. Totally quit by the age of 32.  - EtOH Drinks white or red wine. Favorite wine is Malbec. 2-3 glasses per night, every other night.  If they have wine, they have 2 glasses per night.  Tries to walk to exercise.  Does 45 min to an hour on weekends with her husband. Tries to get 20 minutes of walking. Has an elliptical but has not been doing it like she once did. Husband and her are trying to get back into healthy patterns.  Family History Father was in his 25's when he had a heart attack, and was very stressed out. Brother had esophageal cancer - was a smoker.  Tried to quit, but couldn't.  Was 58, just passed.  Past Medical History  Historically has never had problems with cholesterol, diabetes.  - Rheumatology Follows up with Dr. Olin Pia through Rheumatology. Blood/hematology is checked through rheumatology.  Takes two aspirin per day for her antiphospholipid antibody syndrome. Has systemic lupus erythematous, which has been in  acquiescence. Does not know if her lupus was drug induced. Historically wondered if her lupus was induced by a miscarriage. Heart, lung, and kidneys are not involved; no end organ damage that she knows of.  - Neurology Epilepsy - sees Darrol Angel with Dr. Anne Hahn at [facility]. Wanted to get off of Depakote, but per patient, the doctor refused.  - Dermatology Used to see Dermatology but no longer. Went for a recent visit but cannot remember who she saw.  - OBGYN Dr. Myna Hidalgo through [facility].  - Weight Management Patient is not satisfied with her weight. Patient historically used Weight Watchers.  - Right Foot Pain Former PCP noted that it was a ganglion cyst.  Has been present for a year. Three small hardened nodules, now causing pain and alteration in patient's gait. Located in medial aspect of plantar surface of right foot, in arch area. Patient notes that growth is sometimes bigger, sometimes smaller.  It does not always hurt but is sometimes tender.  Wt Readings from Last 3 Encounters:  03/14/17 211 lb 14.4 oz (96.1 kg)  10/25/16 204 lb 6.4 oz (92.7 kg)  10/22/15 194 lb 6.4 oz (88.2 kg)   BP Readings from Last 3 Encounters:  03/14/17 (!) 144/89  10/25/16 (!) 144/77  10/22/15 128/82   Pulse Readings from Last 3 Encounters:  03/14/17 85  10/25/16 77  10/22/15 60   BMI Readings from Last 3 Encounters:  03/14/17 35.26 kg/m  10/25/16 32.99 kg/m  10/22/15 31.38 kg/m    Patient Care Team    Relationship Specialty Notifications Start End  Thomasene Lot, DO PCP - General Family Medicine  03/14/17   Dermatology, [facility] Consulting Physician   03/14/17    Comment: Has seen dermatologist recently.; yrly fior skin screenings  Myna Hidalgo, DO Consulting Physician Obstetrics and Gynecology  03/14/17   Nilda Riggs, NP Nurse Practitioner Family Medicine  03/14/17   Rossie Muskrat, MD  Rheumatology  03/14/17   Thayer Headings, MD  Consulting Physician Internal Medicine  03/14/17     Patient Active Problem List   Diagnosis Date Noted  . Antiphospholipid antibody syndrome (HCC) 03/14/2017    Priority: High  . SLE (systemic lupus erythematosus) (HCC) 03/14/2017  . Smoking history- 22 yr hx- none since age 45; but really quit the 1 ppd after 22 yrs.  03/14/2017  . Severe obesity (BMI 35.0-39.9) with comorbidity (HCC) 03/14/2017  . Pain, foot, right, chronic 03/14/2017  . Other disorders of bone development and growth, unspecified site 03/14/2017  . Encounter for therapeutic drug monitoring 10/22/2015  . 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 Antiphospholipid antibody syndrome (HCC)   . Epilepsy (HCC)   . Lupus   . Obesity   . Seizures (HCC)    night tremors, last 2006     Past Medical History:  Diagnosis Date  . Antiphospholipid antibody syndrome (HCC)   . Epilepsy (HCC)   . Lupus   . Obesity   . Seizures (HCC)    night tremors, last 2006     Past Surgical History:  Procedure Laterality Date  . FASCIOTOMY Right 07/01/2014   Procedure: FASCIOTOMY RIGHT MIDDLE FINGER/RIGHT RING FINGER;  Surgeon: Cindee Salt, MD;  Location: [facility];  Service: Orthopedics;  Laterality: Right;  . TONSILLECTOMY    . WISDOM TOOTH EXTRACTION       Family History  Problem Relation Age of Onset  . Arthritis Mother   . Coronary artery disease Father   . Anemia Father   . Prostate cancer Father   . Heart attack Father   . Seizures Unknown   . Cancer Brother        throat     Social History   Substance and Sexual Activity  Drug Use No     Social History   Substance and Sexual Activity  Alcohol Use Yes   Comment: occ     Social History   Tobacco Use  Smoking Status Never Smoker  Smokeless Tobacco Never Used     Current Meds  Medication Sig  . aspirin 81 MG chewable tablet Chew 81 mg by mouth 2 (two) times daily.   .  divalproex (DEPAKOTE ER) 500 MG 24 hr tablet TAKE 1 TABLET BY MOUTH EVERY MORNING AND 2 TABLETS BY MOUTH EVERY NIGHT AT BEDTIME  . Multiple Vitamin (MULTIVITAMIN) tablet Take 1 tablet by mouth daily.  Marland Kitchen triamcinolone cream (KENALOG) 0.1 % Apply 1 application topically 2 (two) times daily.    Allergies: Penicillins   Review of Systems  Constitutional: Negative for chills, diaphoresis, fever, malaise/fatigue and weight loss.  HENT: Negative for congestion, sore throat and tinnitus.   Eyes: Negative for blurred vision, double vision and photophobia.  Respiratory: Negative for cough and wheezing.   Cardiovascular: Negative for chest pain and palpitations.  Gastrointestinal: Negative for blood in stool, diarrhea, nausea and vomiting.  Genitourinary: Negative for dysuria, frequency and urgency.  Musculoskeletal: Negative for joint pain and myalgias.  Skin: Negative for itching and rash.  Neurological: Negative for dizziness, focal weakness, weakness and headaches.  Endo/Heme/Allergies: Negative for environmental allergies and polydipsia. Does not bruise/bleed easily.  Psychiatric/Behavioral: Negative for depression and memory loss. The patient is not nervous/anxious and does not have insomnia.      Objective:   Blood pressure (!) 144/89, pulse 85, height 5\' 5"  (1.651 m), weight 211 lb 14.4 oz (96.1 kg), SpO2 98 %. Body mass index is 35.26 kg/m. General: Well Developed, well nourished, and in no acute distress.  Neuro: Alert and oriented x3, extra-ocular muscles intact, sensation grossly intact.  HEENT:[place]/AT, PERRLA, neck supple, No carotid bruits Skin: no gross rashes  Cardiac: Regular rate and rhythm Respiratory: Essentially clear to auscultation bilaterally. Not using accessory muscles, speaking in full sentences.  Abdominal: not grossly distended Musculoskeletal: Ambulates w/o diff, FROM * 4 ext.  Vasc: less 2 sec cap RF, warm and pink  Psych:  No HI/SI, judgement and insight good,  Euthymic mood. Full Affect. Right Foot: Medial aspect plantar surface right foot with three hardened nodules present, ranging from 0.7 cm to -  1.5 cm.  These are tender to palpation; no surrounding erythema or callous formation.

## 2017-03-22 ENCOUNTER — Other Ambulatory Visit: Payer: BC Managed Care – PPO

## 2017-03-22 DIAGNOSIS — M329 Systemic lupus erythematosus, unspecified: Secondary | ICD-10-CM

## 2017-03-22 DIAGNOSIS — Z87891 Personal history of nicotine dependence: Secondary | ICD-10-CM

## 2017-03-22 DIAGNOSIS — D6861 Antiphospholipid syndrome: Secondary | ICD-10-CM

## 2017-03-23 ENCOUNTER — Other Ambulatory Visit: Payer: BC Managed Care – PPO

## 2017-03-23 LAB — CBC WITH DIFFERENTIAL/PLATELET
BASOS: 1 %
Basophils Absolute: 0 10*3/uL (ref 0.0–0.2)
EOS (ABSOLUTE): 0.2 10*3/uL (ref 0.0–0.4)
Eos: 3 %
Hematocrit: 42.7 % (ref 34.0–46.6)
Hemoglobin: 14.4 g/dL (ref 11.1–15.9)
IMMATURE GRANS (ABS): 0 10*3/uL (ref 0.0–0.1)
Immature Granulocytes: 0 %
LYMPHS ABS: 2.6 10*3/uL (ref 0.7–3.1)
LYMPHS: 43 %
MCH: 31.9 pg (ref 26.6–33.0)
MCHC: 33.7 g/dL (ref 31.5–35.7)
MCV: 95 fL (ref 79–97)
Monocytes Absolute: 0.5 10*3/uL (ref 0.1–0.9)
Monocytes: 9 %
NEUTROS ABS: 2.7 10*3/uL (ref 1.4–7.0)
Neutrophils: 44 %
PLATELETS: 232 10*3/uL (ref 150–379)
RBC: 4.51 x10E6/uL (ref 3.77–5.28)
RDW: 13.8 % (ref 12.3–15.4)
WBC: 6 10*3/uL (ref 3.4–10.8)

## 2017-03-23 LAB — COMPREHENSIVE METABOLIC PANEL
ALT: 14 IU/L (ref 0–32)
AST: 26 IU/L (ref 0–40)
Albumin/Globulin Ratio: 1.5 (ref 1.2–2.2)
Albumin: 3.7 g/dL (ref 3.6–4.8)
Alkaline Phosphatase: 56 IU/L (ref 39–117)
BILIRUBIN TOTAL: 0.3 mg/dL (ref 0.0–1.2)
BUN/Creatinine Ratio: 23 (ref 12–28)
BUN: 16 mg/dL (ref 8–27)
CALCIUM: 9.6 mg/dL (ref 8.7–10.3)
CHLORIDE: 107 mmol/L — AB (ref 96–106)
CO2: 28 mmol/L (ref 20–29)
CREATININE: 0.69 mg/dL (ref 0.57–1.00)
GFR calc Af Amer: 108 mL/min/{1.73_m2} (ref >59)
GFR, EST NON AFRICAN AMERICAN: 94 mL/min/{1.73_m2} (ref >59)
GLUCOSE: 90 mg/dL (ref 65–99)
Globulin, Total: 2.4 g/dL (ref 1.5–4.5)
Potassium: 4.6 mmol/L (ref 3.5–5.2)
Sodium: 148 mmol/L — ABNORMAL HIGH (ref 134–144)
TOTAL PROTEIN: 6.1 g/dL (ref 6.0–8.5)

## 2017-03-23 LAB — HEPATITIS C ANTIBODY

## 2017-03-23 LAB — LIPID PANEL
CHOL/HDL RATIO: 2.2 ratio (ref 0.0–4.4)
Cholesterol, Total: 179 mg/dL (ref 100–199)
HDL: 80 mg/dL (ref >39)
LDL Calculated: 88 mg/dL (ref 0–99)
Triglycerides: 56 mg/dL (ref 0–149)
VLDL Cholesterol Cal: 11 mg/dL (ref 5–40)

## 2017-03-23 LAB — T4, FREE: Free T4: 1.2 ng/dL (ref 0.82–1.77)

## 2017-03-23 LAB — TSH: TSH: 3.88 u[IU]/mL (ref 0.450–4.500)

## 2017-03-23 LAB — HIV ANTIBODY (ROUTINE TESTING W REFLEX): HIV SCREEN 4TH GENERATION: NONREACTIVE

## 2017-03-23 LAB — HEMOGLOBIN A1C
ESTIMATED AVERAGE GLUCOSE: 103 mg/dL
HEMOGLOBIN A1C: 5.2 % (ref 4.8–5.6)

## 2017-03-23 LAB — VITAMIN D 25 HYDROXY (VIT D DEFICIENCY, FRACTURES): Vit D, 25-Hydroxy: 44 ng/mL (ref 30.0–100.0)

## 2017-03-23 LAB — T3, FREE: T3 FREE: 2.4 pg/mL (ref 2.0–4.4)

## 2017-03-25 DIAGNOSIS — R030 Elevated blood-pressure reading, without diagnosis of hypertension: Secondary | ICD-10-CM | POA: Insufficient documentation

## 2017-03-27 ENCOUNTER — Telehealth: Payer: Self-pay | Admitting: Family Medicine

## 2017-03-27 NOTE — Telephone Encounter (Signed)
Labs were drawn on 03/22/17.  Please advise. MPulliam, CMA/RT(R)

## 2017-03-27 NOTE — Telephone Encounter (Signed)
Patient can't make appt tomorrow to discuss labs and wants to know if this appt is necessary, if so let the front staff know so that we can r/s her appt.

## 2017-03-28 ENCOUNTER — Ambulatory Visit: Payer: BC Managed Care – PPO | Admitting: Family Medicine

## 2017-03-28 ENCOUNTER — Telehealth: Payer: Self-pay | Admitting: Family Medicine

## 2017-03-28 NOTE — Telephone Encounter (Signed)
Patient called states she was left a message to call office--forwarding to medical assistant to call back.  --glh

## 2017-03-28 NOTE — Telephone Encounter (Signed)
She absolutely does not need to come in for an appointment to discuss labs she does not wish to.    Labs are all essentially normal.    I recommend she follows up in 4 months with me to discuss weight management etc.

## 2017-03-28 NOTE — Telephone Encounter (Signed)
Called patient to notified, left message to call the office back. MPulliam, CMA/RT(R)

## 2017-03-29 NOTE — Telephone Encounter (Signed)
Called patient and notified. MPulliam, CMA/RT(R)  

## 2017-03-29 NOTE — Telephone Encounter (Signed)
Called and notified patient of lab results.  Please see pervious note. MPulliam, CMA/RT(R)

## 2017-03-30 ENCOUNTER — Ambulatory Visit
Admission: RE | Admit: 2017-03-30 | Discharge: 2017-03-30 | Disposition: A | Discharge status: Home / Self Care | Payer: BC Managed Care – PPO | Source: Ambulatory Visit | Attending: Obstetrics & Gynecology | Admitting: Obstetrics & Gynecology

## 2017-03-30 DIAGNOSIS — Z1231 Encounter for screening mammogram for malignant neoplasm of breast: Secondary | ICD-10-CM

## 2017-04-11 ENCOUNTER — Ambulatory Visit: Payer: BC Managed Care – PPO | Admitting: Podiatry

## 2017-04-11 ENCOUNTER — Ambulatory Visit (INDEPENDENT_AMBULATORY_CARE_PROVIDER_SITE_OTHER): Payer: BC Managed Care – PPO

## 2017-04-11 ENCOUNTER — Encounter: Payer: Self-pay | Admitting: Podiatry

## 2017-04-11 VITALS — BP 148/90 | HR 76 | Ht 62.25 in | Wt 206.0 lb

## 2017-04-11 DIAGNOSIS — M79671 Pain in right foot: Secondary | ICD-10-CM | POA: Diagnosis not present

## 2017-04-11 DIAGNOSIS — M722 Plantar fascial fibromatosis: Secondary | ICD-10-CM

## 2017-04-11 NOTE — Progress Notes (Signed)
Subjective:   Patient ID: Brooke James, female   DOB: 62 y.o.   MRN: [id number]   HPI  62 year old female presents the office today for concerns of soft tissue masses to the bottom portion of the left foot about 1.5 years.  She states that it only hurts if she was a stiffer soled shoe and with certain pressure.  She states it is not hurting on a daily basis and she has not had any swelling or redness.  She also has a similar issue in her right hand where she had surgery for previously.  She denies any recent injury or trauma she denies any swelling to her feet or any other concerns today.   Review of Systems  All other systems reviewed and are negative.  Past Medical History:  Diagnosis Date  . Antiphospholipid antibody syndrome (HCC)   . Epilepsy (HCC)   . Lupus   . Obesity   . Seizures (HCC)    night tremors, last 2006    Past Surgical History:  Procedure Laterality Date  . FASCIOTOMY Right 07/01/2014   Procedure: FASCIOTOMY RIGHT MIDDLE FINGER/RIGHT RING FINGER;  Surgeon: Cindee SaltGary Kuzma, MD;  Location: [facility];  Service: Orthopedics;  Laterality: Right;  . TONSILLECTOMY    . WISDOM TOOTH EXTRACTION       Current Outpatient Medications:  .  aspirin 81 MG chewable tablet, Chew 81 mg by mouth 2 (two) times daily. , Disp: , Rfl:  .  divalproex (DEPAKOTE ER) 500 MG 24 hr tablet, TAKE 1 TABLET BY MOUTH EVERY MORNING AND 2 TABLETS BY MOUTH EVERY NIGHT AT BEDTIME, Disp: 270 tablet, Rfl: 3 .  Multiple Vitamin (MULTIVITAMIN) tablet, Take 1 tablet by mouth daily., Disp: , Rfl:  .  triamcinolone cream (KENALOG) 0.1 %, Apply 1 application topically 2 (two) times daily., Disp: , Rfl:   Allergies  Allergen Reactions  . Penicillins Rash    Social History   Socioeconomic History  . Marital status: Married    Spouse name: Not on file  . Number of children: 0  . Years of education: PhD  . Highest education level: Not on file  Occupational History  . Not on file  Social  Needs  . Financial resource strain: Not on file  . Food insecurity:    Worry: Not on file    Inability: Not on file  . Transportation needs:    Medical: Not on file    Non-medical: Not on file  Tobacco Use  . Smoking status: Never Smoker  . Smokeless tobacco: Never Used  Substance and Sexual Activity  . Alcohol use: Yes    Comment: occ  . Drug use: No  . Sexual activity: Yes    Birth control/protection: None  Lifestyle  . Physical activity:    Days per week: Not on file    Minutes per session: Not on file  . Stress: Not on file  Relationships  . Social connections:    Talks on phone: Not on file    Gets together: Not on file    Attends religious service: Not on file    Active member of club or organization: Not on file    Attends meetings of clubs or organizations: Not on file    Relationship status: Not on file  . Intimate partner violence:    Fear of current or ex partner: Not on file    Emotionally abused: Not on file    Physically abused: Not on file  Forced sexual activity: Not on file  Other Topics Concern  . Not on file  Social History Narrative   Patient is married.    Patient has no children.    Patient has a PhD in Microbiologist.               Objective:  Physical Exam  General: AAO x3, NAD  Dermatological: Skin is warm, dry and supple bilateral. Nails x 10 are well manicured; remaining integument appears unremarkable at this time. There are no open sores, no preulcerative lesions, no rash or signs of infection present.  Vascular: Dorsalis Pedis artery and Posterior Tibial artery pedal pulses are 2/4 bilateral with immedate capillary fill time. There is no pain with calf compression, swelling, warmth, erythema.   Neruologic: Grossly intact via light touch bilateral. . Protective threshold with Semmes Wienstein monofilament intact to all pedal sites bilateral.  Musculoskeletal: On the plantar aspect of the right foot there is 3 non-mobile firm soft  tissue masses present within the medial band of plantar fascia and one  lesion on the left foot.  These are consistent with a plantar fibroma.  There is no tenderness palpation of the area there is no overlying edema, erythema, increase in warmth.  No other areas of tenderness identified bilaterally.  Muscular strength 5/5 in all groups tested bilateral.  Gait: Unassisted, Nonantalgic.      Assessment:   Bilateral plantar fibromatosis     Plan:  -Treatment options discussed including all alternatives, risks, and complications -Etiology of symptoms were discussed -X-rays were obtained and reviewed with the patient.  Skin marker was utilized to identify areas of soft tissue masses in the right foot.  No calcifications of foreign body present.  No bony destruction. -Steroid injection however she has multiple lesions.  We will start by using verapamil cream which I ordered today through [facility] pharmacy.  Discussed stretching, icing exercises daily. -Call if symptoms are not improving next couple of weeks or sooner if any issues are to arise.  Call any questions or concerns or any change in symptoms.   Vivi Barrack DPM

## 2017-04-11 NOTE — Patient Instructions (Signed)

## 2017-04-13 ENCOUNTER — Telehealth: Payer: Self-pay | Admitting: Family Medicine

## 2017-04-13 NOTE — Telephone Encounter (Signed)
Patient was told to call back about lab results, left VM over lunch

## 2017-04-13 NOTE — Telephone Encounter (Signed)
Called patient.  She had received letter about her labs.  Patient had already been notified of lab results. MPulliam, CMA/RT(R)

## 2017-10-24 NOTE — Progress Notes (Signed)
[facility]  PATIENT: Brooke James DOB: October 25, 1955   REASON FOR VISIT: Follow-up for generalized epilepsy HISTORY FROM: Patient    HISTORY OF PRESENT ILLNESS:Brooke James is a 62 year-old right-handed white female with a history of nocturnal seizures. The patient is on Depakote taking 500 mgER in the morning and 1000 mg in the evening. The patient is tolerating this medication very well with the exception of a fine action tremor associated with the medication. The patient denies any other significant medical issues since last seen. The patient had  routine blood work done through her primary care physician. The patient has not had any seizures in greater than 6 years.  She needs refills today. She returns for reevaluation.She claims her lupus is in good control at present.  REVIEW OF SYSTEMS: Full 14 system review of systems performed and notable only for those listed, all others are neg:  Constitutional: neg  Cardiovascular: neg Ear/Nose/Throat: neg  Skin: neg Eyes: neg Respiratory: neg Gastroitestinal: neg  Hematology/Lymphatic: neg  Endocrine: neg Musculoskeletal:joint pain Allergy/Immunology: neg Neurological: history of seizure disorder Psychiatric: neg Sleep : neg   ALLERGIES: Allergies  Allergen Reactions  . Penicillins Rash    HOME MEDICATIONS: Outpatient Medications Prior to Visit  Medication Sig Dispense Refill  . aspirin 81 MG chewable tablet Chew 81 mg by mouth 2 (two) times daily.     . divalproex (DEPAKOTE ER) 500 MG 24 hr tablet TAKE 1 TABLET BY MOUTH EVERY MORNING AND 2 TABLETS BY MOUTH EVERY NIGHT AT BEDTIME 270 tablet 3  . Multiple Vitamin (MULTIVITAMIN) tablet Take 1 tablet by mouth daily.    Brooke James [facility]  Scar Cream -  Verapamil 10%, Pentoxifylline 5% Apply 1-2 grams to affected area 3-4 times daily Qty. 120 gm 3 refills    . triamcinolone cream (KENALOG) 0.1 % Apply 1 application topically 2 (two) times  daily.     No facility-administered medications prior to visit.     PAST MEDICAL HISTORY: Past Medical History:  Diagnosis Date  . Antiphospholipid antibody syndrome (HCC)   . Epilepsy (HCC)   . Lupus (HCC)   . Obesity   . Seizures (HCC)    night tremors, last 2006    PAST SURGICAL HISTORY: Past Surgical History:  Procedure Laterality Date  . FASCIOTOMY Right 07/01/2014   Procedure: FASCIOTOMY RIGHT MIDDLE FINGER/RIGHT RING FINGER;  Surgeon: Cindee Salt, MD;  Location:  SUR[facility]ERY [facility];  Service: Orthopedics;  Laterality: Right;  . TONSILLECTOMY    . WISDOM TOOTH EXTRACTION      FAMILY HISTORY: Family History  Problem Relation Age of Onset  . Arthritis Mother   . Coronary artery disease Father   . Anemia Father   . Prostate cancer Father   . Heart attack Father   . Seizures Unknown   . Cancer Brother        throat    SOCIAL HISTORY: Social History   Socioeconomic History  . Marital status: Married    Spouse name: Not on file  . Number of children: 0  . Years of education: PhD  . Highest education level: Not on file  Occupational History  . Not on file  Social Needs  . Financial resource strain: Not on file  . Food insecurity:    Worry: Not on file    Inability: Not on file  . Transportation needs:    Medical: Not on file    Non-medical: Not on file  Tobacco Use  .  Smoking status: Never Smoker  . Smokeless tobacco: Never Used  Substance and Sexual Activity  . Alcohol use: Yes    Comment: occ  . Drug use: No  . Sexual activity: Yes    Birth control/protection: None  Lifestyle  . Physical activity:    Days per week: Not on file    Minutes per session: Not on file  . Stress: Not on file  Relationships  . Social connections:    Talks on phone: Not on file    Gets together: Not on file    Attends religious service: Not on file    Active member of club or organization: Not on file    Attends meetings of clubs or organizations: Not on file      Relationship status: Not on file  . Intimate partner violence:    Fear of current or ex partner: Not on file    Emotionally abused: Not on file    Physically abused: Not on file    Forced sexual activity: Not on file  Other Topics Concern  . Not on file  Social History Narrative   Patient is married.    Patient has no children.    Patient has a PhD in Microbiologist.            PHYSICAL EXAM  Vitals:   10/26/17 0935  BP: 127/81  Pulse: 73  Weight: 205 lb 9.6 oz (93.3 kg)  Height: 5' 2.5" (1.588 m)   Body mass index is 37.01 kg/m. Generalized: Well developed, obese female in no acute distress  Head: normocephalic and atraumatic Oropharynx benign  Neck: Supple, Musculoskeletal: No deformity  Neurological examination  Mentation: Alert oriented to time, place, history taking. Follows all commands speech and language fluent  Cranial nerve II-XII: Pupils were equal round reactive to light extraocular movements were full, visual field were full on confrontational test. Facial sensation and strength were normal. hearing was intact to finger rubbing bilaterally. Uvula tongue midline. head turning and shoulder shrug and were normal and symmetric.Tongue protrusion into cheek strength was normal.  Motor: normal bulk and tone, full strength in the BUE, BLE, fine finger movements normal, no pronator drift. No focal weakness  Coordination: finger-nose-finger, heel-to-shin bilaterally, no dysmetria , fine action tremor noted Reflexes: symmetric upper and lower, plantar responses were flexor bilaterally.  Gait and Station: Rising up from seated position without assistance, normal stance, moderate stride, good arm swing, smooth turning, able to perform tiptoe, and heel walking without difficulty.  Tandem gait is steady  DIAGNOSTIC DATA (LABS, IMAGING, TESTING) - I reviewed patient records, labs, notes, testing and imaging myself where available.  Lab Results  Component Value Date    WBC 6.0 03/22/2017   HGB 14.4 03/22/2017   HCT 42.7 03/22/2017   MCV 95 03/22/2017   PLT 232 03/22/2017    ASSESSMENT AND PLAN  62 y.o. year old female  has a past medical history of  generalized  Seizures here to follow-up. No seizure activity in greater than 6 years. Well controlled on Depakote.      PLAN Continue Depakote 500 mg ER, 3 daily, will refill CBC and CMP from 03/22/17 WNL  Followup yearly and when necessary Call for any seizure activity Nilda Riggs, [facility], [facility], APRN  [facility] [address], [address] [place], [place] [address] [phone]

## 2017-10-26 ENCOUNTER — Encounter: Payer: Self-pay | Admitting: Nurse Practitioner

## 2017-10-26 ENCOUNTER — Ambulatory Visit: Payer: BC Managed Care – PPO | Admitting: Nurse Practitioner

## 2017-10-26 VITALS — BP 127/81 | HR 73 | Ht 62.5 in | Wt 205.6 lb

## 2017-10-26 DIAGNOSIS — G40309 Generalized idiopathic epilepsy and epileptic syndromes, not intractable, without status epilepticus: Secondary | ICD-10-CM

## 2017-10-26 MED ORDER — DIVALPROEX SODIUM ER 500 MG PO TB24
ORAL_TABLET | ORAL | 3 refills | Status: DC
Start: 2017-10-26 — End: 2018-10-29

## 2017-10-26 NOTE — Progress Notes (Signed)
I have read the note, and I agree with the clinical assessment and plan.  Brooke James   

## 2017-10-26 NOTE — Patient Instructions (Signed)
Continue Depakote 500 mg ER, 3 daily, will refill CBC and CMP from 03/22/17 WNL  Followup yearly and when necessary Call for any seizure activity

## 2018-01-30 ENCOUNTER — Telehealth: Payer: Self-pay | Admitting: *Deleted

## 2018-01-30 NOTE — Telephone Encounter (Signed)
Received fax from [facility], divalproex needs PA. Attempted PA on CMM, [email or web address]. Received message : [facility] has indicated that it is too soon to refill this medication at the pharmacy for your patient. If you need to renew an existing PA for your patient's medication, please reach out to [facility] directly at [phone]   [facility], spoke with Grimes. She stated the medication has already been approved. Her record shows a refill was picked up today, 01/30/2018. She does not know when the current PA expires; next refill can be picked up on 04/08/18.  Will wait to see if new PA is needed in [facility].

## 2018-02-20 ENCOUNTER — Other Ambulatory Visit: Payer: Self-pay | Admitting: Obstetrics & Gynecology

## 2018-02-20 DIAGNOSIS — Z1231 Encounter for screening mammogram for malignant neoplasm of breast: Secondary | ICD-10-CM

## 2018-04-10 ENCOUNTER — Ambulatory Visit: Payer: BC Managed Care – PPO

## 2018-05-08 ENCOUNTER — Ambulatory Visit: Payer: BC Managed Care – PPO

## 2018-06-26 ENCOUNTER — Ambulatory Visit: Payer: BC Managed Care – PPO

## 2018-09-04 ENCOUNTER — Ambulatory Visit
Admission: RE | Admit: 2018-09-04 | Discharge: 2018-09-04 | Disposition: A | Discharge status: Home / Self Care | Payer: BC Managed Care – PPO | Source: Ambulatory Visit | Attending: Obstetrics & Gynecology | Admitting: Obstetrics & Gynecology

## 2018-09-04 ENCOUNTER — Other Ambulatory Visit: Payer: Self-pay

## 2018-09-04 DIAGNOSIS — Z1231 Encounter for screening mammogram for malignant neoplasm of breast: Secondary | ICD-10-CM

## 2018-10-16 LAB — HM DIABETES EYE EXAM

## 2018-10-29 ENCOUNTER — Other Ambulatory Visit: Payer: Self-pay | Admitting: *Deleted

## 2018-10-29 MED ORDER — DIVALPROEX SODIUM ER 500 MG PO TB24: ORAL_TABLET | ORAL | 3 refills | Status: AC

## 2018-11-01 ENCOUNTER — Ambulatory Visit: Payer: BC Managed Care – PPO | Admitting: Family Medicine

## 2018-11-01 ENCOUNTER — Ambulatory Visit: Payer: BC Managed Care – PPO | Admitting: Neurology

## 2019-02-26 ENCOUNTER — Ambulatory Visit: Payer: BC Managed Care – PPO

## 2019-02-28 ENCOUNTER — Ambulatory Visit: Payer: BC Managed Care – PPO

## 2019-03-17 ENCOUNTER — Ambulatory Visit: Payer: BC Managed Care – PPO | Attending: Internal Medicine

## 2019-03-17 DIAGNOSIS — Z23 Encounter for immunization: Secondary | ICD-10-CM

## 2019-03-17 NOTE — Progress Notes (Signed)
   Covid-19 Vaccination Clinic  Name:  VERENISE MOULIN    MRN: [id number] DOB: 04/24/1955  03/17/2019  Ms. Ruminski was observed post Covid-19 immunization for 15 minutes without incidence. She was provided with Vaccine Information Sheet and instruction to access the V-Safe system.   Ms. Rayos was instructed to call 911 with any severe reactions post vaccine: Marland Kitchen Difficulty breathing  . Swelling of your face and throat  . A fast heartbeat  . A bad rash all over your body  . Dizziness and weakness    Immunizations Administered    Name Date Dose VIS Date Route   Pfizer COVID-19 Vaccine 03/17/2019 10:43 AM 0.3 mL 12/28/2018 Intramuscular   Manufacturer: [employer], [employer]   Lot: [id number]   NDC: [address]-5

## 2019-04-10 ENCOUNTER — Ambulatory Visit: Payer: BC Managed Care – PPO | Attending: Internal Medicine

## 2019-04-10 DIAGNOSIS — Z23 Encounter for immunization: Secondary | ICD-10-CM

## 2019-04-10 NOTE — Progress Notes (Signed)
   Covid-19 Vaccination Clinic  Name:  Brooke James    MRN: [id number] DOB: 1955-02-13  04/10/2019  Brooke James was observed post Covid-19 immunization for 15 minutes without incident. She was provided with Vaccine Information Sheet and instruction to access the V-Safe system.   Brooke James was instructed to call 911 with any severe reactions post vaccine: Marland Kitchen Difficulty breathing  . Swelling of face and throat  . A fast heartbeat  . A bad rash all over body  . Dizziness and weakness   Immunizations Administered    Name Date Dose VIS Date Route   Pfizer COVID-19 Vaccine 04/10/2019  4:19 PM 0.3 mL 12/28/2018 Intramuscular   Manufacturer: [employer], [employer]   Lot: [id number]   NDC: [address]-0

## 2019-10-25 ENCOUNTER — Ambulatory Visit: Payer: BC Managed Care – PPO | Attending: Internal Medicine

## 2019-10-25 DIAGNOSIS — Z23 Encounter for immunization: Secondary | ICD-10-CM

## 2019-10-25 NOTE — Progress Notes (Signed)
   Covid-19 Vaccination Clinic  Name:  Brooke James    MRN: [id number] DOB: [birth year]  10/25/2019  Brooke James was observed post Covid-19 immunization for 15 minutes without incident. She was provided with Vaccine Information Sheet and instruction to access the V-Safe system.   Brooke James was instructed to call 911 with any severe reactions post vaccine: Marland Kitchen Difficulty breathing  . Swelling of face and throat  . A fast heartbeat  . A bad rash all over body  . Dizziness and weakness

## 2020-04-15 ENCOUNTER — Other Ambulatory Visit: Payer: Self-pay | Admitting: Rheumatology

## 2020-04-15 DIAGNOSIS — Z1231 Encounter for screening mammogram for malignant neoplasm of breast: Secondary | ICD-10-CM

## 2020-06-05 ENCOUNTER — Ambulatory Visit: Payer: BC Managed Care – PPO

## 2020-07-30 ENCOUNTER — Ambulatory Visit
Admission: RE | Admit: 2020-07-30 | Discharge: 2020-07-30 | Disposition: A | Discharge status: Home / Self Care | Payer: BC Managed Care – PPO | Source: Ambulatory Visit | Attending: Rheumatology | Admitting: Rheumatology

## 2020-07-30 ENCOUNTER — Other Ambulatory Visit: Payer: Self-pay

## 2020-07-30 DIAGNOSIS — Z1231 Encounter for screening mammogram for malignant neoplasm of breast: Secondary | ICD-10-CM

## 2021-11-11 ENCOUNTER — Other Ambulatory Visit: Payer: Self-pay | Admitting: Internal Medicine

## 2021-11-11 DIAGNOSIS — Z1231 Encounter for screening mammogram for malignant neoplasm of breast: Secondary | ICD-10-CM

## 2021-11-18 ENCOUNTER — Ambulatory Visit
Admission: RE | Admit: 2021-11-18 | Discharge: 2021-11-18 | Disposition: A | Discharge status: Home / Self Care | Payer: Medicare PPO | Source: Ambulatory Visit

## 2021-11-18 DIAGNOSIS — Z1231 Encounter for screening mammogram for malignant neoplasm of breast: Secondary | ICD-10-CM

## 2021-11-23 ENCOUNTER — Other Ambulatory Visit: Payer: Self-pay | Admitting: Internal Medicine

## 2021-11-23 DIAGNOSIS — R928 Other abnormal and inconclusive findings on diagnostic imaging of breast: Secondary | ICD-10-CM

## 2021-11-25 ENCOUNTER — Ambulatory Visit
Admission: RE | Admit: 2021-11-25 | Discharge: 2021-11-25 | Disposition: A | Discharge status: Home / Self Care | Payer: Medicare PPO | Source: Ambulatory Visit | Attending: Internal Medicine | Admitting: Internal Medicine

## 2021-11-25 ENCOUNTER — Ambulatory Visit: Payer: Medicare PPO

## 2021-11-25 DIAGNOSIS — R928 Other abnormal and inconclusive findings on diagnostic imaging of breast: Secondary | ICD-10-CM

## 2022-02-21 DIAGNOSIS — Z1211 Encounter for screening for malignant neoplasm of colon: Secondary | ICD-10-CM | POA: Diagnosis not present

## 2022-02-21 DIAGNOSIS — K5730 Diverticulosis of large intestine without perforation or abscess without bleeding: Secondary | ICD-10-CM | POA: Diagnosis not present

## 2022-02-21 DIAGNOSIS — K648 Other hemorrhoids: Secondary | ICD-10-CM | POA: Diagnosis not present

## 2022-03-29 DIAGNOSIS — H43812 Vitreous degeneration, left eye: Secondary | ICD-10-CM | POA: Diagnosis not present

## 2022-05-05 DIAGNOSIS — H43812 Vitreous degeneration, left eye: Secondary | ICD-10-CM | POA: Diagnosis not present

## 2022-05-05 DIAGNOSIS — H25813 Combined forms of age-related cataract, bilateral: Secondary | ICD-10-CM | POA: Diagnosis not present

## 2022-11-21 DIAGNOSIS — R5383 Other fatigue: Secondary | ICD-10-CM | POA: Diagnosis not present

## 2022-11-21 DIAGNOSIS — M329 Systemic lupus erythematosus, unspecified: Secondary | ICD-10-CM | POA: Diagnosis not present

## 2022-11-21 DIAGNOSIS — Z0000 Encounter for general adult medical examination without abnormal findings: Secondary | ICD-10-CM | POA: Diagnosis not present

## 2022-11-21 DIAGNOSIS — R760 Raised antibody titer: Secondary | ICD-10-CM | POA: Diagnosis not present

## 2022-11-21 DIAGNOSIS — G40909 Epilepsy, unspecified, not intractable, without status epilepticus: Secondary | ICD-10-CM | POA: Diagnosis not present

## 2022-11-28 DIAGNOSIS — G40909 Epilepsy, unspecified, not intractable, without status epilepticus: Secondary | ICD-10-CM | POA: Diagnosis not present

## 2022-11-28 DIAGNOSIS — R5383 Other fatigue: Secondary | ICD-10-CM | POA: Diagnosis not present

## 2022-11-28 DIAGNOSIS — N182 Chronic kidney disease, stage 2 (mild): Secondary | ICD-10-CM | POA: Diagnosis not present

## 2022-11-28 DIAGNOSIS — Z0000 Encounter for general adult medical examination without abnormal findings: Secondary | ICD-10-CM | POA: Diagnosis not present

## 2022-11-28 DIAGNOSIS — M329 Systemic lupus erythematosus, unspecified: Secondary | ICD-10-CM | POA: Diagnosis not present

## 2022-11-28 DIAGNOSIS — R760 Raised antibody titer: Secondary | ICD-10-CM | POA: Diagnosis not present

## 2023-01-26 ENCOUNTER — Other Ambulatory Visit: Payer: Self-pay | Admitting: Internal Medicine

## 2023-01-26 DIAGNOSIS — Z1231 Encounter for screening mammogram for malignant neoplasm of breast: Secondary | ICD-10-CM

## 2023-02-14 DIAGNOSIS — M7671 Peroneal tendinitis, right leg: Secondary | ICD-10-CM | POA: Diagnosis not present

## 2023-02-14 DIAGNOSIS — M7661 Achilles tendinitis, right leg: Secondary | ICD-10-CM | POA: Diagnosis not present

## 2023-02-14 DIAGNOSIS — M7062 Trochanteric bursitis, left hip: Secondary | ICD-10-CM | POA: Diagnosis not present

## 2023-02-14 DIAGNOSIS — M7672 Peroneal tendinitis, left leg: Secondary | ICD-10-CM | POA: Diagnosis not present

## 2023-02-15 ENCOUNTER — Ambulatory Visit
Admission: RE | Admit: 2023-02-15 | Discharge: 2023-02-15 | Disposition: A | Discharge status: Home / Self Care | Payer: Medicare PPO | Source: Ambulatory Visit | Attending: Internal Medicine | Admitting: Internal Medicine

## 2023-02-15 DIAGNOSIS — Z1231 Encounter for screening mammogram for malignant neoplasm of breast: Secondary | ICD-10-CM

## 2023-03-02 DIAGNOSIS — H524 Presbyopia: Secondary | ICD-10-CM | POA: Diagnosis not present

## 2023-03-02 DIAGNOSIS — H25813 Combined forms of age-related cataract, bilateral: Secondary | ICD-10-CM | POA: Diagnosis not present

## 2023-03-02 DIAGNOSIS — H40013 Open angle with borderline findings, low risk, bilateral: Secondary | ICD-10-CM | POA: Diagnosis not present

## 2023-03-02 DIAGNOSIS — H353131 Nonexudative age-related macular degeneration, bilateral, early dry stage: Secondary | ICD-10-CM | POA: Diagnosis not present

## 2023-03-02 DIAGNOSIS — H35362 Drusen (degenerative) of macula, left eye: Secondary | ICD-10-CM | POA: Diagnosis not present

## 2023-05-15 DIAGNOSIS — H25813 Combined forms of age-related cataract, bilateral: Secondary | ICD-10-CM | POA: Diagnosis not present

## 2023-05-15 DIAGNOSIS — H353131 Nonexudative age-related macular degeneration, bilateral, early dry stage: Secondary | ICD-10-CM | POA: Diagnosis not present

## 2023-06-20 DIAGNOSIS — H25813 Combined forms of age-related cataract, bilateral: Secondary | ICD-10-CM | POA: Diagnosis not present

## 2023-06-20 DIAGNOSIS — H268 Other specified cataract: Secondary | ICD-10-CM | POA: Diagnosis not present

## 2023-06-23 DIAGNOSIS — H25811 Combined forms of age-related cataract, right eye: Secondary | ICD-10-CM | POA: Diagnosis not present

## 2023-06-27 DIAGNOSIS — H25811 Combined forms of age-related cataract, right eye: Secondary | ICD-10-CM | POA: Diagnosis not present

## 2023-06-27 DIAGNOSIS — H268 Other specified cataract: Secondary | ICD-10-CM | POA: Diagnosis not present

## 2023-06-27 DIAGNOSIS — H2511 Age-related nuclear cataract, right eye: Secondary | ICD-10-CM | POA: Diagnosis not present

## 2023-07-21 IMAGING — MR RM SACRO ILIACAS
5 of 9 series · 26 of 48 positions shown · non-contrast
Comparison: none

[Series 5: T2 · sagittal · 4.0mm · 0.53mm/px · 6 of 34 slices shown (1 of 2)]
[im 1/34]
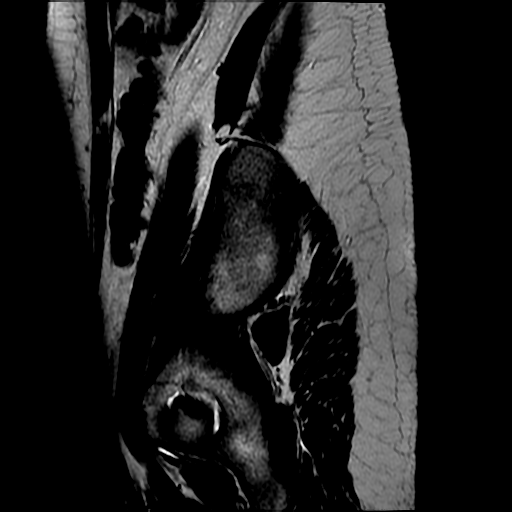
[im 7/34]
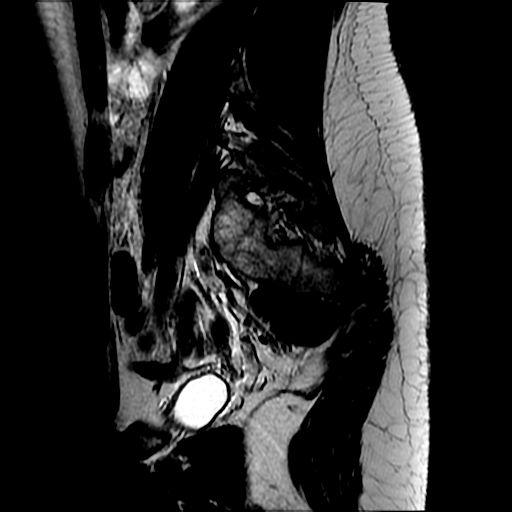
[im 14/34]
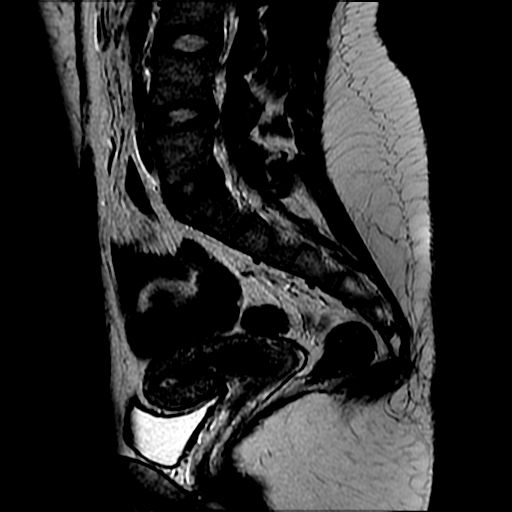
[im 20/34]
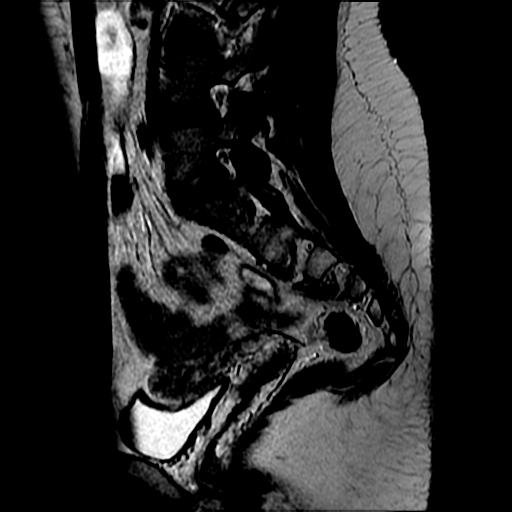
[im 27/34]
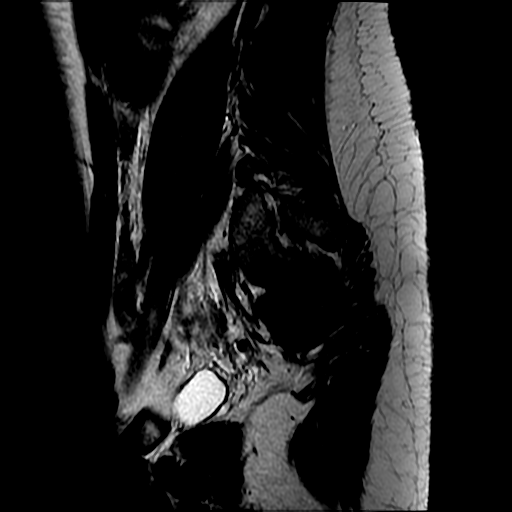
[im 34/34]
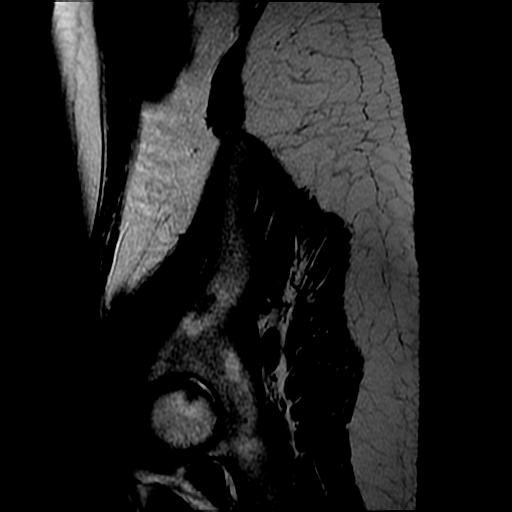

[Series 6: T1 · sagittal · 4.0mm · 0.53mm/px · 6 of 34 slices shown]
[im 1/34]
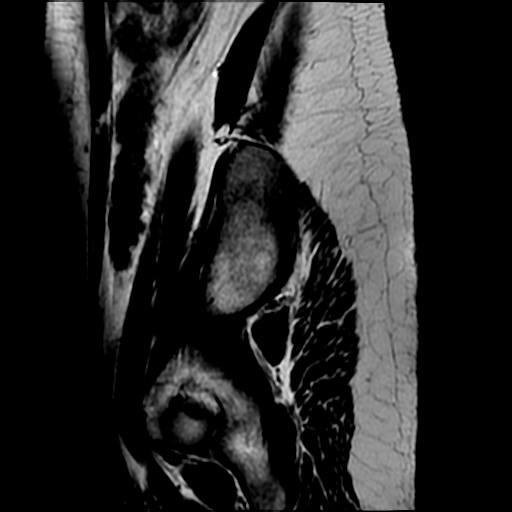
[im 7/34]
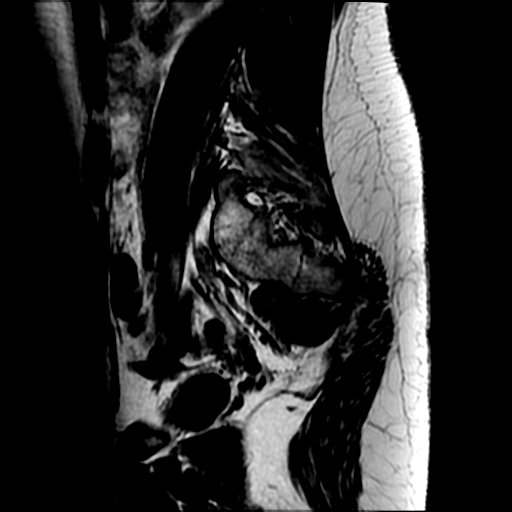
[im 14/34]
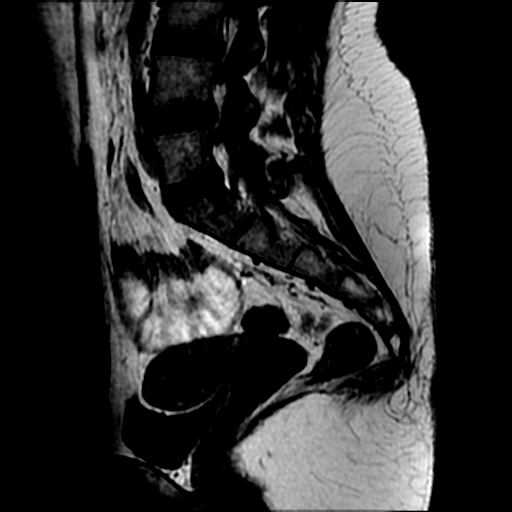
[im 20/34]
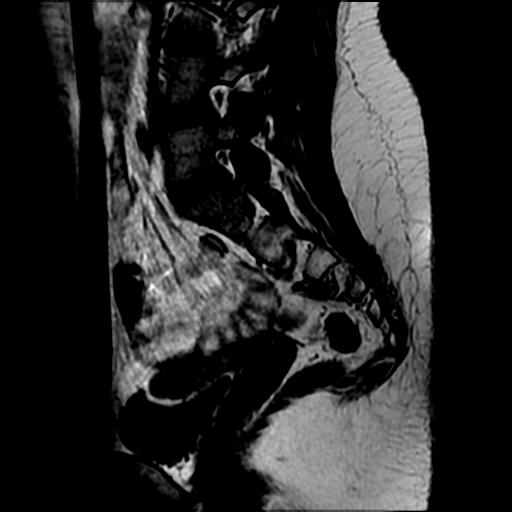
[im 27/34]
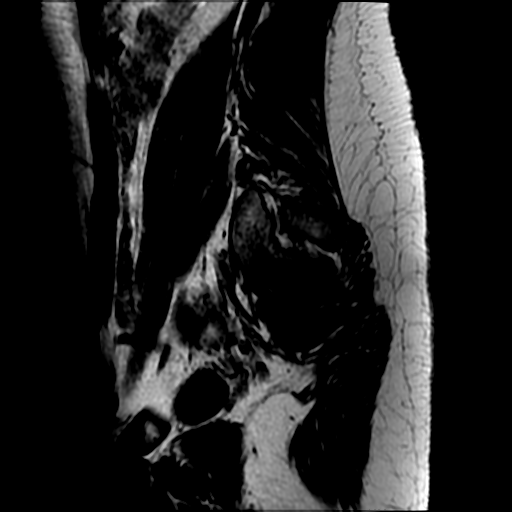
[im 34/34]
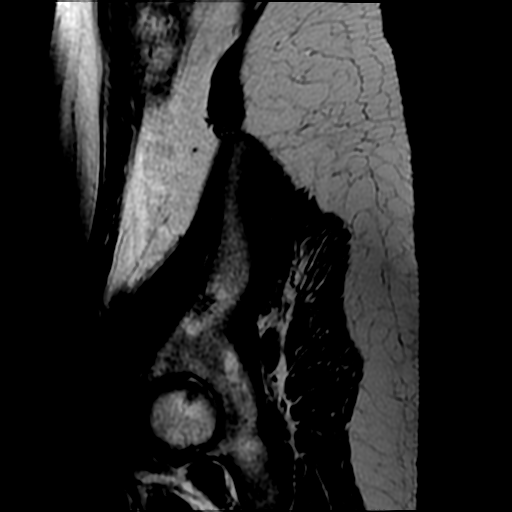

[Series 7: T2 · oblique · 4.0mm · 0.39mm/px · 5 of 32 slices shown (2 of 2)]
[im 1/32]
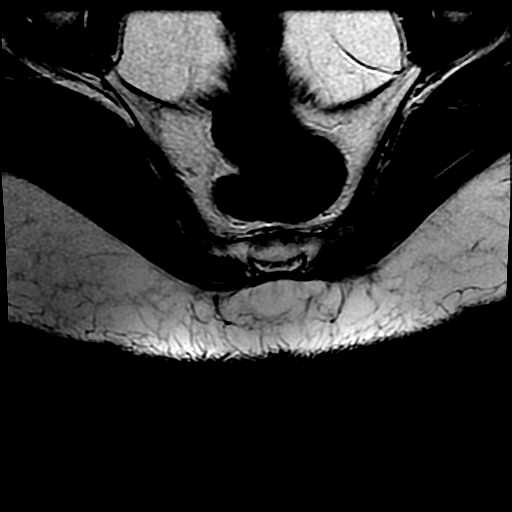
[im 8/32]
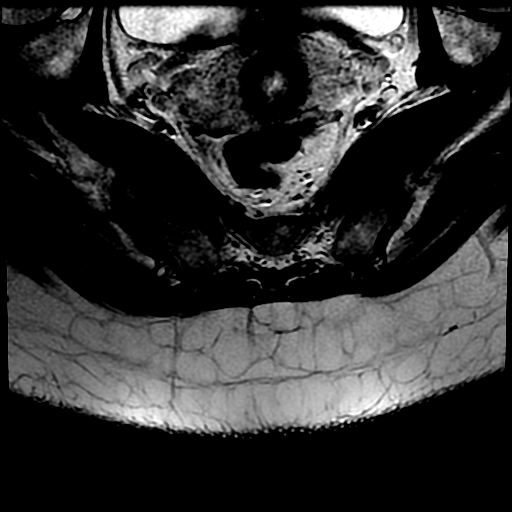
[im 16/32]
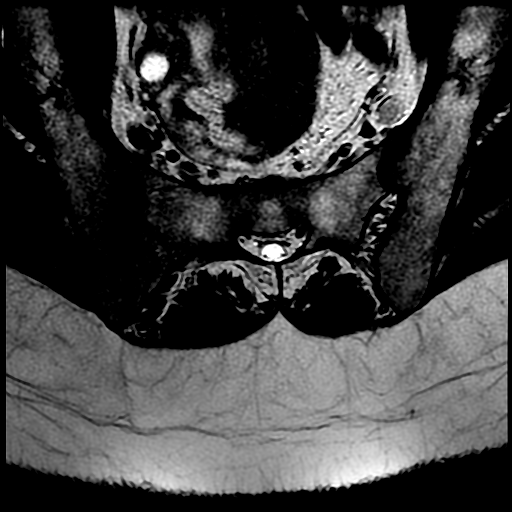
[im 24/32]
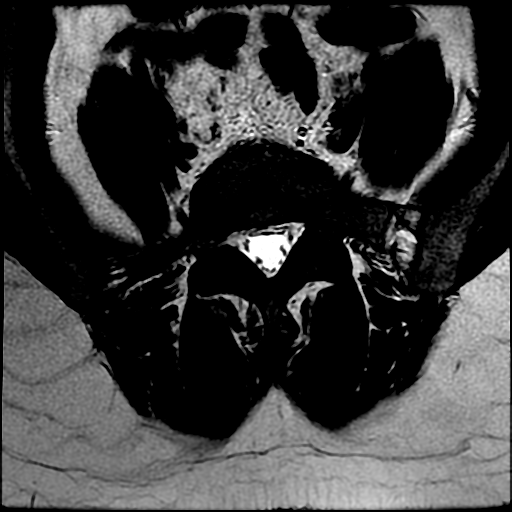
[im 32/32]
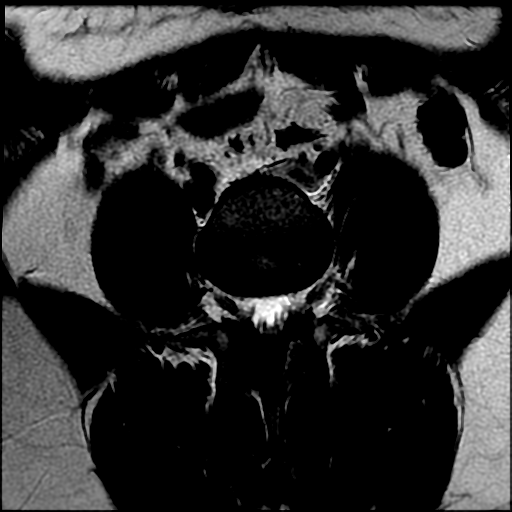

[Series 10: T1 fat-sat · oblique · non-contrast · 4.0mm · 0.78mm/px · 5 of 32 slices shown (1 of 2)]
[im 1/32]
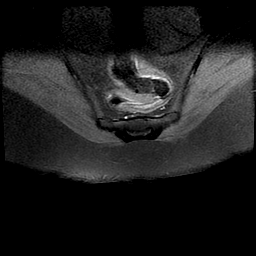
[im 8/32]
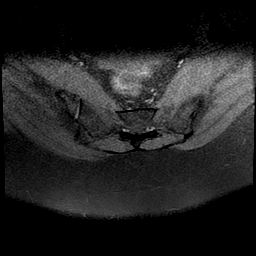
[im 16/32]
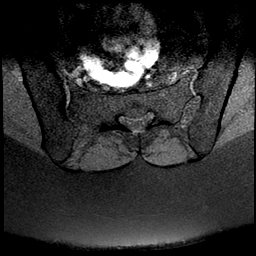
[im 24/32]
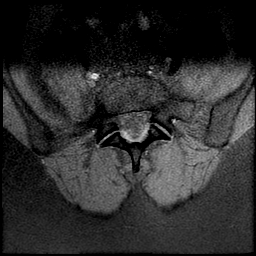
[im 32/32]
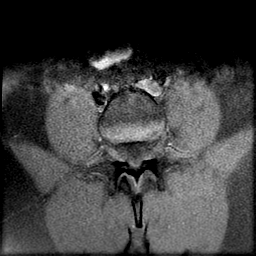

[Series 11: T1 fat-sat · oblique · 4.0mm · 0.78mm/px · 4 of 32 slices shown (2 of 2)]
[im 1/32]
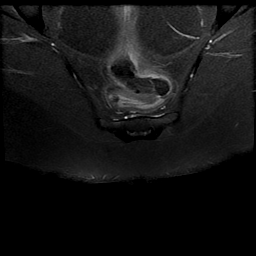
[im 8/32]
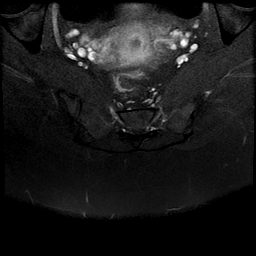
[im 16/32]
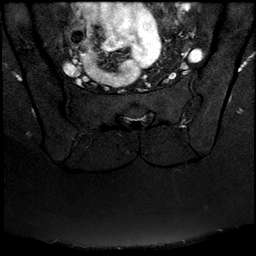
[im 24/32]
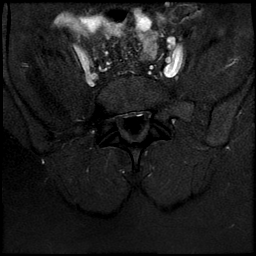

[26 of 48 positions shown; findings below may reference images not displayed]

RESSONÂNCIA MAGNÉTICA DAS ARTICULAÇÕES SACROILÍACAS

TÉCNICA:
Exame realizado em equipamento de ressonância magnética com sequências, ponderações e planos específicos para o segmento de interesse, antes e após a administração endovenosa do meio de contraste.
RESULTADO:
Estruturas ósseas com morfologia e intensidade de sinal conservadas.
Articulações sacroilíacas simétricas, com espaços conservados e regulares.
Superfícies condrais regulares.
Não há derrame articular significativo.
Não se evidenciam áreas de impregnação anômala pelo contraste.
CONCLUSÃO:
Ressonância magnética das articulações sacroilíacas sem alterações significativas.

## 2023-07-21 IMAGING — MR RM COLUNA LOMBAR
4 of 6 series · 19 of 48 positions shown · non-contrast
Comparison: none

[Series 4: T2 · sagittal · 4.0mm · 0.59mm/px · 7 of 14 slices shown (1 of 2)]
[im 1/14]
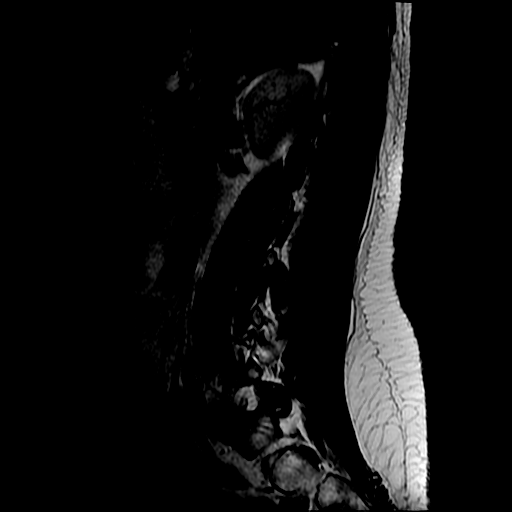
[im 3/14]
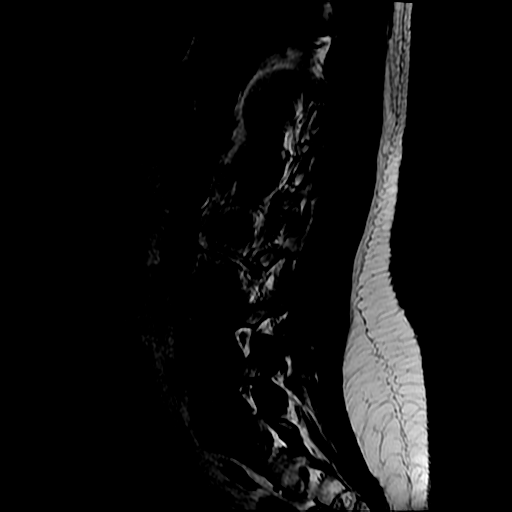
[im 5/14]
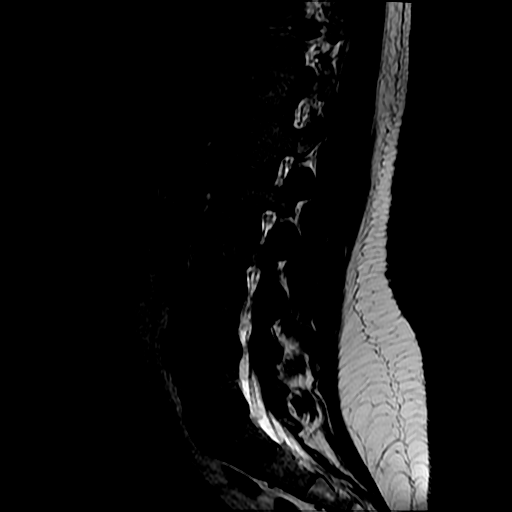
[im 7/14]
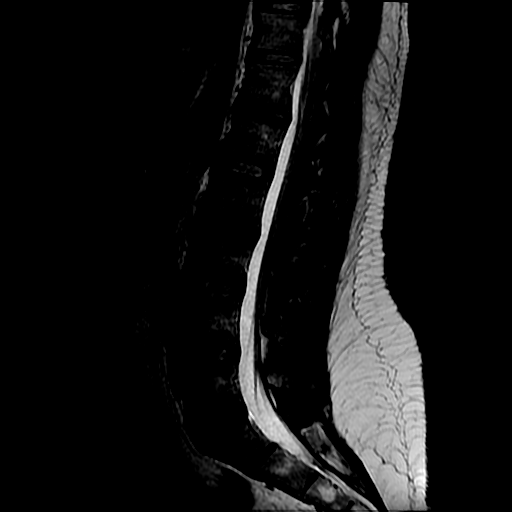
[im 9/14]
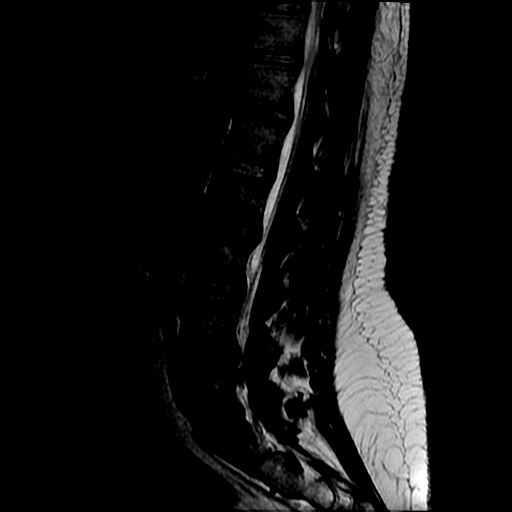
[im 11/14]
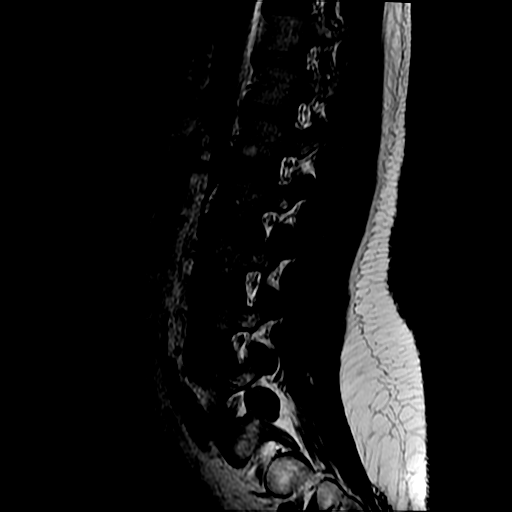
[im 14/14]
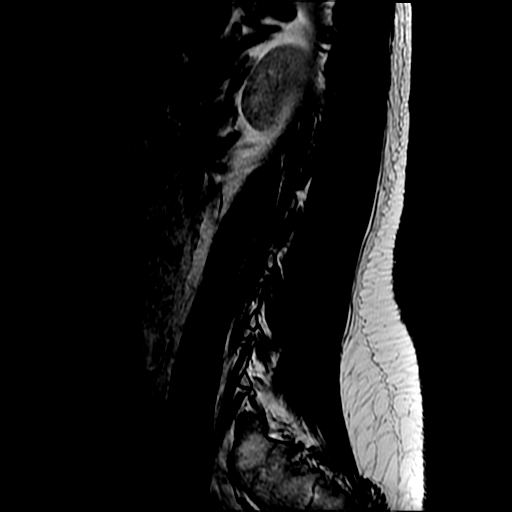

[Series 5: T1 · sagittal · 4.0mm · 0.59mm/px · 3 of 14 slices shown (1 of 2)]
[im 3/14]
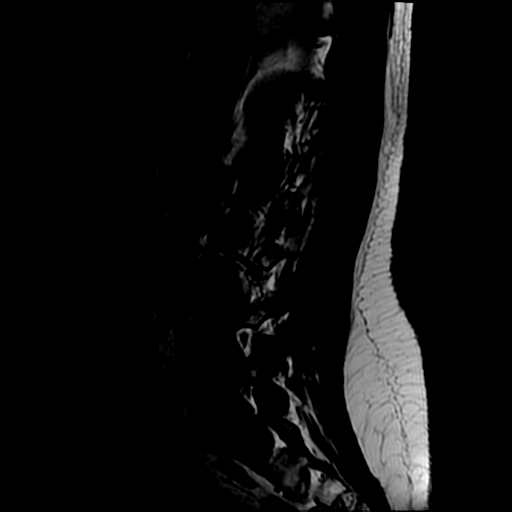
[im 7/14]
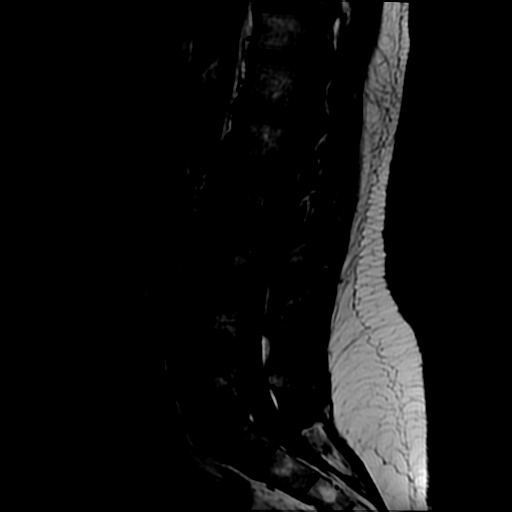
[im 11/14]
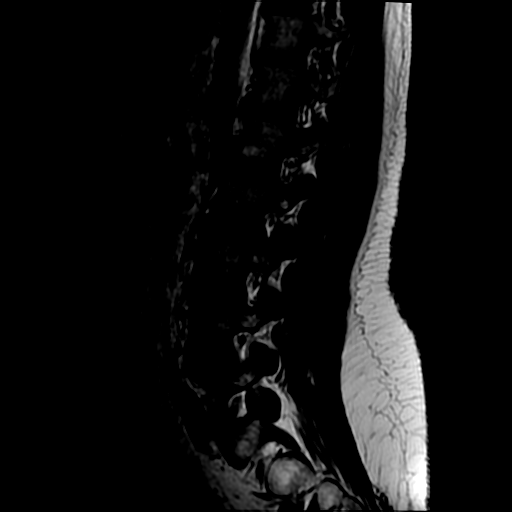

[Series 8: T2 · axial · 4.0mm · 0.39mm/px · z∈[-67,+133]mm · 6 of 25 slices shown (2 of 2)]
[im 1/25]
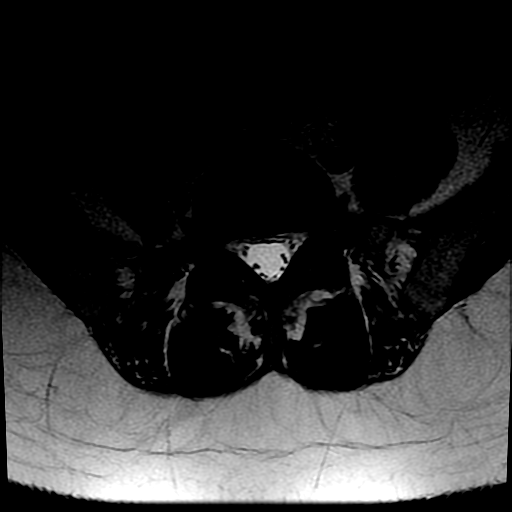
[im 3/25]
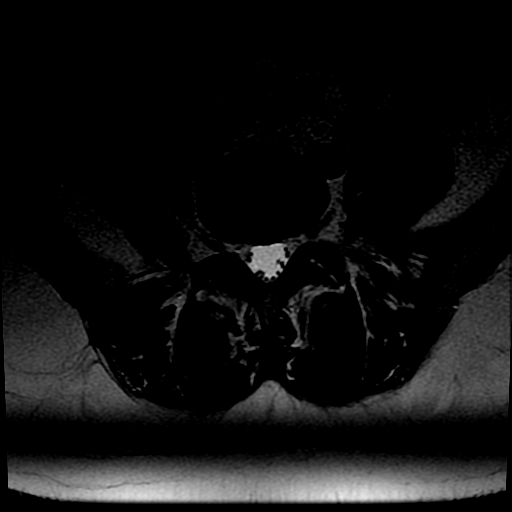
[im 5/25]
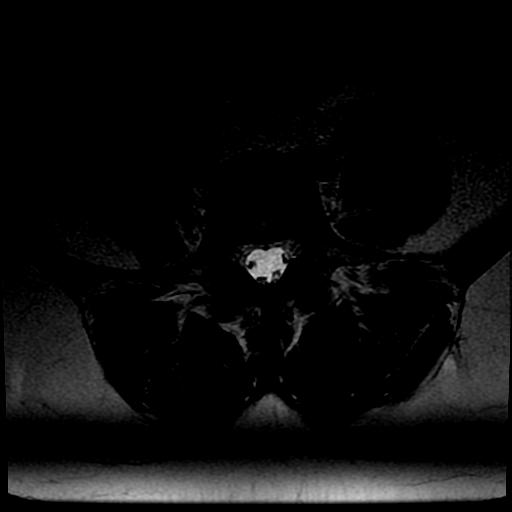
[im 8/25]
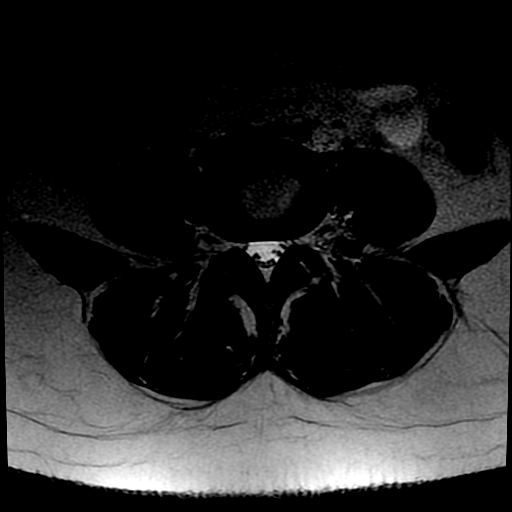
[im 13/25]
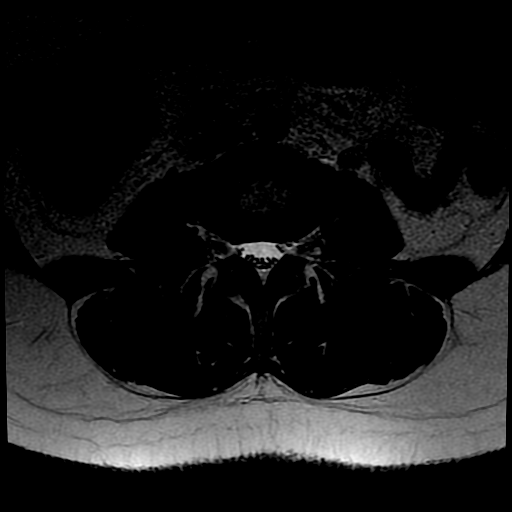
[im 22/25]
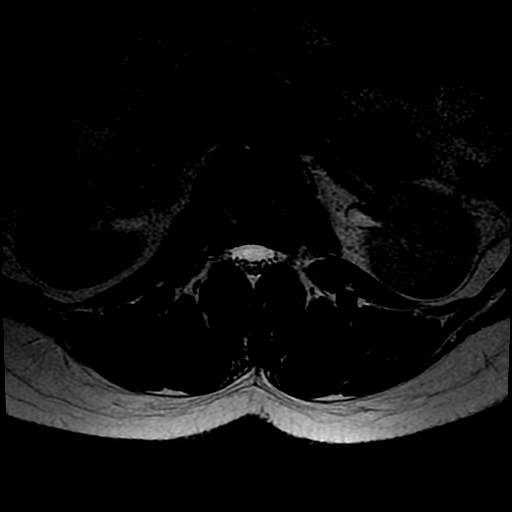

[Series 9: T1 · axial · 4.0mm · 0.39mm/px · z∈[-59,+133]mm · 3 of 25 slices shown (2 of 2)]
[im 3/25]
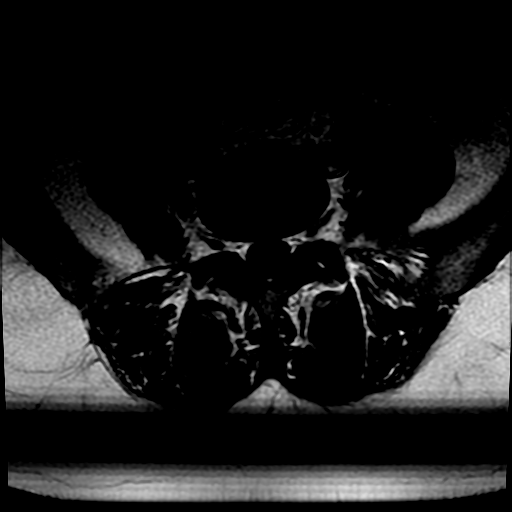
[im 13/25]
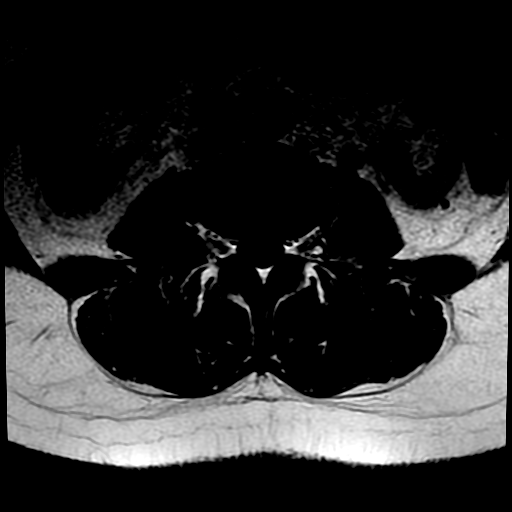
[im 22/25]
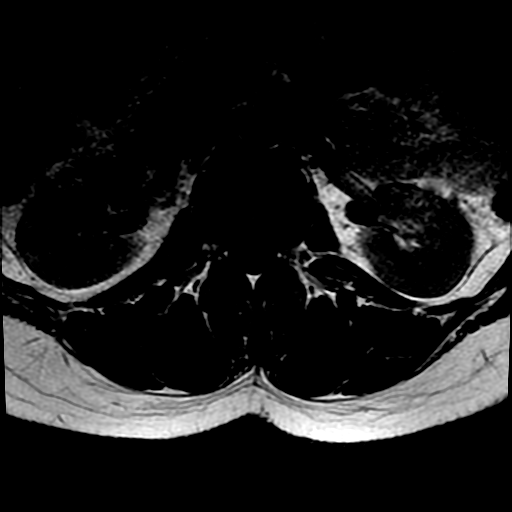

[19 of 48 positions shown; findings below may reference images not displayed]

RESSONÂNCIA MAGNÉTICA DE COLUNA LOMBAR

TÉCNICA:
Exame realizado em equipamento de ressonância magnética com sequências, ponderações e planos específicos para o segmento de interesse, sem a administração endovenosa do meio de contraste.

RESULTADO:
Desidratação discal em L2-L3 em L5-S1.
Nódulos de Schmorl em alguns corpos vertebrais lombares.
Edema dos ligamentos interespinhosos em L4-L5 e L5-S1, por provável hipersolicitação mecânica.
Abaulamento discal difuso em L2-L3 e em L5-S1, insinuando-se aos recessos laterais e comprimindo a face ventral do saco tecal.
Os corpos vertebrais visualizados são alinhados.
Demais forames de conjugação visualizados são livres e apresentam amplitudes usuais.
Elementos posteriores visualizados íntegros.
Articulações interapofisárias preservadas.
O canal vertebral ósseo apresenta amplitude usual na região estudada.
O cone medular é tópico e tem aspecto anatômico.

CONCLUSÃO:
Desidratação discal em L2-L3 em L5-S1.
Nódulos de Schmorl em alguns corpos vertebrais lombares.
Edema dos ligamentos interespinhosos em L4-L5 e L5-S1, por provável hipersolicitação mecânica.
Abaulamento discal difuso em L2-L3 e em L5-S1, insinuando-se aos recessos laterais e comprimindo a face ventral do saco tecal.

## 2023-11-30 DIAGNOSIS — R5383 Other fatigue: Secondary | ICD-10-CM | POA: Diagnosis not present

## 2023-11-30 DIAGNOSIS — M329 Systemic lupus erythematosus, unspecified: Secondary | ICD-10-CM | POA: Diagnosis not present

## 2023-11-30 DIAGNOSIS — N182 Chronic kidney disease, stage 2 (mild): Secondary | ICD-10-CM | POA: Diagnosis not present

## 2023-12-12 DIAGNOSIS — Z0000 Encounter for general adult medical examination without abnormal findings: Secondary | ICD-10-CM | POA: Diagnosis not present

## 2023-12-12 DIAGNOSIS — R760 Raised antibody titer: Secondary | ICD-10-CM | POA: Diagnosis not present

## 2023-12-12 DIAGNOSIS — M329 Systemic lupus erythematosus, unspecified: Secondary | ICD-10-CM | POA: Diagnosis not present

## 2023-12-12 DIAGNOSIS — N182 Chronic kidney disease, stage 2 (mild): Secondary | ICD-10-CM | POA: Diagnosis not present

## 2023-12-12 DIAGNOSIS — R5383 Other fatigue: Secondary | ICD-10-CM | POA: Diagnosis not present

## 2023-12-12 DIAGNOSIS — G40909 Epilepsy, unspecified, not intractable, without status epilepticus: Secondary | ICD-10-CM | POA: Diagnosis not present

## 2023-12-12 DIAGNOSIS — E7841 Elevated Lipoprotein(a): Secondary | ICD-10-CM | POA: Diagnosis not present
